# Patient Record
Sex: Male | Born: 1995 | Race: Black or African American | Hispanic: No | Marital: Single | Smoking: Never smoker
Health system: Southern US, Community
[De-identification: ages and names within clinical notes are randomized; demographics above are authoritative.]

## PROBLEM LIST (undated history)

## (undated) DIAGNOSIS — K5190 Ulcerative colitis, unspecified, without complications: Secondary | ICD-10-CM

## (undated) DIAGNOSIS — F319 Bipolar disorder, unspecified: Secondary | ICD-10-CM

---

## 2015-02-02 ENCOUNTER — Encounter (HOSPITAL_COMMUNITY): Payer: Self-pay | Admitting: *Deleted

## 2015-02-02 ENCOUNTER — Emergency Department (HOSPITAL_COMMUNITY)
Admission: EM | Admit: 2015-02-02 | Discharge: 2015-02-02 | Disposition: A | Discharge status: Home / Self Care | Payer: Self-pay | Attending: Emergency Medicine | Admitting: Emergency Medicine

## 2015-02-02 ENCOUNTER — Emergency Department (HOSPITAL_COMMUNITY): Payer: Self-pay

## 2015-02-02 DIAGNOSIS — Z79899 Other long term (current) drug therapy: Secondary | ICD-10-CM | POA: Insufficient documentation

## 2015-02-02 DIAGNOSIS — R110 Nausea: Secondary | ICD-10-CM | POA: Insufficient documentation

## 2015-02-02 DIAGNOSIS — R1084 Generalized abdominal pain: Secondary | ICD-10-CM | POA: Insufficient documentation

## 2015-02-02 DIAGNOSIS — K5190 Ulcerative colitis, unspecified, without complications: Secondary | ICD-10-CM | POA: Insufficient documentation

## 2015-02-02 DIAGNOSIS — R630 Anorexia: Secondary | ICD-10-CM | POA: Insufficient documentation

## 2015-02-02 DIAGNOSIS — F329 Major depressive disorder, single episode, unspecified: Secondary | ICD-10-CM | POA: Insufficient documentation

## 2015-02-02 HISTORY — DX: Ulcerative colitis, unspecified, without complications: K51.90

## 2015-02-02 LAB — COMPREHENSIVE METABOLIC PANEL
ALT: 14 U/L — ABNORMAL LOW (ref 17–63)
AST: 21 U/L (ref 15–41)
Albumin: 3.6 g/dL (ref 3.5–5.0)
Alkaline Phosphatase: 49 U/L (ref 38–126)
Anion gap: 8 (ref 5–15)
BUN: 5 mg/dL — ABNORMAL LOW (ref 6–20)
CO2: 26 mmol/L (ref 22–32)
Calcium: 9.1 mg/dL (ref 8.9–10.3)
Chloride: 106 mmol/L (ref 101–111)
Creatinine, Ser: 0.96 mg/dL (ref 0.61–1.24)
GFR calc Af Amer: >60 mL/min (ref >60)
GFR calc non Af Amer: >60 mL/min (ref >60)
Glucose, Bld: 91 mg/dL (ref 65–99)
Potassium: 5.1 mmol/L (ref 3.5–5.1)
Sodium: 140 mmol/L (ref 135–145)
Total Bilirubin: 0.7 mg/dL (ref 0.3–1.2)
Total Protein: 5.9 g/dL — ABNORMAL LOW (ref 6.5–8.1)

## 2015-02-02 LAB — CBC
HCT: 47.2 % (ref 39.0–52.0)
Hemoglobin: 15.6 g/dL (ref 13.0–17.0)
MCH: 28.3 pg (ref 26.0–34.0)
MCHC: 33.1 g/dL (ref 30.0–36.0)
MCV: 85.5 fL (ref 78.0–100.0)
Platelets: 151 10*3/uL (ref 150–400)
RBC: 5.52 MIL/uL (ref 4.22–5.81)
RDW: 13.1 % (ref 11.5–15.5)
WBC: 8.1 10*3/uL (ref 4.0–10.5)

## 2015-02-02 LAB — URINALYSIS, ROUTINE W REFLEX MICROSCOPIC
BILIRUBIN URINE: NEGATIVE
Glucose, UA: NEGATIVE mg/dL
Hgb urine dipstick: NEGATIVE
KETONES UR: 15 mg/dL — AB
LEUKOCYTES UA: NEGATIVE
NITRITE: NEGATIVE
PROTEIN: NEGATIVE mg/dL
Specific Gravity, Urine: >1.046 — ABNORMAL HIGH (ref 1.005–1.030)
pH: 6 (ref 5.0–8.0)

## 2015-02-02 LAB — LITHIUM LEVEL

## 2015-02-02 LAB — LIPASE, BLOOD: Lipase: 23 U/L (ref 11–51)

## 2015-02-02 MED ORDER — SODIUM CHLORIDE 0.9 % IV SOLN
1000.0000 mL | Freq: Once | INTRAVENOUS | Status: AC
  Administered 2015-02-02: 1000 mL via INTRAVENOUS

## 2015-02-02 MED ORDER — PREDNISONE 20 MG PO TABS
60.0000 mg | ORAL_TABLET | ORAL | Status: AC
  Administered 2015-02-02: 60 mg via ORAL
  Filled 2015-02-02: qty 3

## 2015-02-02 MED ORDER — HYDROCODONE-ACETAMINOPHEN 5-325 MG PO TABS: 1.0000 | ORAL_TABLET | Freq: Four times a day (QID) | ORAL | Status: DC | PRN

## 2015-02-02 MED ORDER — HYDROMORPHONE HCL 1 MG/ML IJ SOLN
0.5000 mg | Freq: Once | INTRAMUSCULAR | Status: AC
  Administered 2015-02-02: 0.5 mg via INTRAVENOUS
  Filled 2015-02-02: qty 1

## 2015-02-02 MED ORDER — LITHIUM CARBONATE 150 MG PO CAPS: 150.0000 mg | ORAL_CAPSULE | Freq: Every day | ORAL | Status: DC

## 2015-02-02 MED ORDER — IOHEXOL 300 MG/ML  SOLN
100.0000 mL | Freq: Once | INTRAMUSCULAR | Status: AC | PRN
  Administered 2015-02-02: 100 mL via INTRAVENOUS

## 2015-02-02 MED ORDER — ONDANSETRON HCL 4 MG/2ML IJ SOLN
4.0000 mg | Freq: Once | INTRAMUSCULAR | Status: AC
  Administered 2015-02-02: 4 mg via INTRAVENOUS
  Filled 2015-02-02: qty 2

## 2015-02-02 MED ORDER — PREDNISONE 20 MG PO TABS: 60.0000 mg | ORAL_TABLET | Freq: Every day | ORAL | Status: AC

## 2015-02-02 NOTE — ED Notes (Signed)
Pt c/o abd pain onset today in all quadrants, denies n/v/d, denies CP & SOB, A&O x 4, pt dx of Ulcerative Colitis

## 2015-02-02 NOTE — ED Notes (Signed)
Patient is alert and orientedx4.  Patient was explained discharge instructions and they understood them with no questions.   

## 2015-02-02 NOTE — ED Notes (Signed)
Pt left with urinal at bedside to provide a sample when he is able.

## 2015-02-02 NOTE — ED Provider Notes (Signed)
CSN: [id number]     Arrival date & time 02/02/15  1350 History   First MD Initiated Contact with Patient 02/02/15 1517     Chief Complaint  Patient presents with  . Abdominal Pain     HPI  She presents with concern of severe abdominal pain. Pain began earlier today, since onset has been persistent, is diffuse, intermittently severe in different areas of the abdomen, without focal pain. Pain is described as sharp, unrelenting. Typically, the patient has brief pain episodes, that resolved with rest. Today's pain has not improved with rest. Patient acknowledges a history of ulcerative colitis, depression, denies other substantial medical problems. No history of prior surgery. No current fever, chills, vomiting, diarrhea. There is nausea, anorexia.   Past Medical History  Diagnosis Date  . Ulcerative colitis (HCC)    History reviewed. No pertinent past surgical history. No family history on file. Social History  Substance Use Topics  . Smoking status: Never Smoker   . Smokeless tobacco: None  . Alcohol Use: No    Review of Systems  Constitutional:       Per HPI, otherwise negative  HENT:       Per HPI, otherwise negative  Respiratory:       Per HPI, otherwise negative  Cardiovascular:       Per HPI, otherwise negative  Gastrointestinal: Positive for nausea. Negative for vomiting.  Endocrine:       Negative aside from HPI  Genitourinary:       Neg aside from HPI   Musculoskeletal:       Per HPI, otherwise negative  Skin: Negative.   Neurological: Negative for syncope.      Allergies  Review of patient's allergies indicates no known allergies.  Home Medications   Prior to Admission medications   Medication Sig Start Date End Date Taking? Authorizing Provider  FLUoxetine (PROZAC) 20 MG capsule Take 20 mg by mouth 2 (two) times daily. Take 20 mg in the morning and 10 mg at night.   Yes Historical Provider, MD  lithium carbonate 150 MG capsule Take 150 mg by  mouth daily.   Yes Historical Provider, MD  mesalamine (CANASA) 1000 MG suppository Place 1,000 mg rectally at bedtime.   Yes Historical Provider, MD  Mesalamine (LIALDA PO) Take 4 capsules by mouth 2 (two) times daily.   Yes Historical Provider, MD   BP 115/73 mmHg  Pulse 90  Temp(Src) 99 F (37.2 C) (Oral)  Resp 12  Ht 5\' 11"  (1.803 m)  Wt 170 lb (77.111 kg)  BMI 23.72 kg/m2  SpO2 99% Physical Exam  Constitutional: He is oriented to person, place, and time. He appears well-developed. No distress.  HENT:  Head: Normocephalic and atraumatic.  Eyes: Conjunctivae and EOM are normal.  Cardiovascular: Normal rate and regular rhythm.   Pulmonary/Chest: Effort normal. No stridor. No respiratory distress.  Abdominal: He exhibits no distension. There is generalized tenderness. There is guarding. There is no rigidity.  Musculoskeletal: He exhibits no edema.  Neurological: He is alert and oriented to person, place, and time.  Skin: Skin is warm and dry.  Psychiatric: He has a normal mood and affect.  Nursing note and vitals reviewed.   ED Course  Procedures (including critical care time) Labs Review Labs Reviewed  COMPREHENSIVE METABOLIC PANEL - Abnormal; Notable for the following:    BUN 5 (*)    Total Protein 5.9 (*)    ALT 14 (*)    All other components within  normal limits  LIPASE, BLOOD  CBC  URINALYSIS, ROUTINE W REFLEX MICROSCOPIC (NOT AT [facility])    Imaging Review Ct Abdomen Pelvis W Contrast  02/02/2015  CLINICAL DATA:  Ulcer colitis. Generalized abdominal pain. Some nausea. EXAM: CT ABDOMEN AND PELVIS WITH CONTRAST TECHNIQUE: Multidetector CT imaging of the abdomen and pelvis was performed using the standard protocol following bolus administration of intravenous contrast. CONTRAST:  OMNIPAQUE IOHEXOL 300 MG/ML  SOLN COMPARISON:  None FINDINGS: Lower chest: Lung bases are clear. Hepatobiliary: No focal hepatic lesion. No biliary duct dilatation. Gallbladder is normal.  Common bile duct is normal. Pancreas: Pancreas is normal. No ductal dilatation. No pancreatic inflammation. Spleen: Normal spleen Adrenals/urinary tract: Adrenal glands and kidneys are normal. The ureters and bladder normal. Stomach/Bowel: The stomach and duodenum are normal. There multiple abnormal loops of small bowel a which are moderately distended by fluid and have submucosal edema. Majority these abnormal loops appear distal in the ileum. The terminal ileum is mildly thickened enhancing as seen on image 55, series 6. There is submucosal edema in loops of distal ileum as seen on coronal image 65, series 6. The submucosal edema measures up to 4 mm. Other loops of more proximal small bowel are decompressed (image 32, series 6). No evidence of high-grade obstruction. The appendix is elongated and partially filled with high-density material (image 65 series 3 and image 55 series 30. This elongated appendix extends in a retrocecal course. The colon itself appears normal. There is a moderate volume stool in the transverse and descending colon. No evidence of mucosal inflammation in the colon or sigmoid colon. The rectum is grossly normal although collapsed distally. Vascular/Lymphatic: Abdominal aorta is normal caliber. There is no retroperitoneal or periportal lymphadenopathy. No pelvic lymphadenopathy. Reproductive: Smaller free fluid the pelvis.  Prostate gland normal. Other: Small free fluid the pelvis Musculoskeletal: No aggressive osseous lesion. IMPRESSION: 1. Evidence of active inflammatory bowel disease with multiple long segments of fluid-filled mildly distended small bowel with submucosal edema. Inflammation of the terminal ileum. No evidence of high-grade obstruction. 2. No evidence of involvement of the colon. The rectum is poorly evaluated. 3. Appendix is elongated and mildly distended but no evidence of inflammation Electronically Signed   By: Genevive Bi M.D.   On: 02/02/2015 17:13   I have  personally reviewed and evaluated these images and lab results as part of my medical decision-making.  5:34 PM On repeat exam the patient has improved. Lithium level pending. Given concern for ulcerative colitis flare, patient will be started on steroids   7:29 PM Lithium level is subtherapeutic. Patient aware of this. No new complaints. Patient will be discharged to follow-up with primary care, gastroenterology. MDM  Male with ulcerative colitis presents with diffuse severe abdominal pain. Here the patient is awake, alert, with a non-peritoneal, though diffusely tender abdomen. No evidence for bacteremia or sepsis. Patient likely has ulcerative colitis flare. The patient is also subtherapeutic in his lithium dosing  Patient will follow-up with gastroenterology.  Gerhard Munch, MD 02/02/15 [phone]

## 2015-02-02 NOTE — Discharge Instructions (Signed)
As discussed, it is important that you follow up with your physician for continued management of your condition. ° °If you develop any new, or concerning changes in your condition, please return to the emergency department immediately. °

## 2015-02-02 NOTE — ED Notes (Signed)
MD at bedside. 

## 2015-02-10 ENCOUNTER — Emergency Department (HOSPITAL_COMMUNITY)
Admission: EM | Admit: 2015-02-10 | Discharge: 2015-02-10 | Disposition: A | Discharge status: Home / Self Care | Payer: Self-pay | Attending: Emergency Medicine | Admitting: Emergency Medicine

## 2015-02-10 ENCOUNTER — Encounter (HOSPITAL_COMMUNITY): Payer: Self-pay | Admitting: Emergency Medicine

## 2015-02-10 DIAGNOSIS — R109 Unspecified abdominal pain: Secondary | ICD-10-CM

## 2015-02-10 DIAGNOSIS — F329 Major depressive disorder, single episode, unspecified: Secondary | ICD-10-CM | POA: Insufficient documentation

## 2015-02-10 DIAGNOSIS — R110 Nausea: Secondary | ICD-10-CM | POA: Insufficient documentation

## 2015-02-10 DIAGNOSIS — R1084 Generalized abdominal pain: Secondary | ICD-10-CM | POA: Insufficient documentation

## 2015-02-10 DIAGNOSIS — Z79899 Other long term (current) drug therapy: Secondary | ICD-10-CM | POA: Insufficient documentation

## 2015-02-10 DIAGNOSIS — K921 Melena: Secondary | ICD-10-CM | POA: Insufficient documentation

## 2015-02-10 DIAGNOSIS — Z8719 Personal history of other diseases of the digestive system: Secondary | ICD-10-CM

## 2015-02-10 LAB — COMPREHENSIVE METABOLIC PANEL
ALT: 13 U/L — AB (ref 17–63)
AST: 18 U/L (ref 15–41)
Albumin: 2.9 g/dL — ABNORMAL LOW (ref 3.5–5.0)
Alkaline Phosphatase: 44 U/L (ref 38–126)
Anion gap: 9 (ref 5–15)
BILIRUBIN TOTAL: 0.3 mg/dL (ref 0.3–1.2)
BUN: 11 mg/dL (ref 6–20)
CO2: 25 mmol/L (ref 22–32)
CREATININE: 0.87 mg/dL (ref 0.61–1.24)
Calcium: 8.6 mg/dL — ABNORMAL LOW (ref 8.9–10.3)
Chloride: 106 mmol/L (ref 101–111)
Glucose, Bld: 111 mg/dL — ABNORMAL HIGH (ref 65–99)
POTASSIUM: 3.8 mmol/L (ref 3.5–5.1)
Sodium: 140 mmol/L (ref 135–145)
TOTAL PROTEIN: 5 g/dL — AB (ref 6.5–8.1)

## 2015-02-10 LAB — CBC WITH DIFFERENTIAL/PLATELET
BASOS ABS: 0 10*3/uL (ref 0.0–0.1)
Basophils Relative: 0 %
EOS PCT: 2 %
Eosinophils Absolute: 0.2 10*3/uL (ref 0.0–0.7)
HCT: 39.5 % (ref 39.0–52.0)
Hemoglobin: 13.1 g/dL (ref 13.0–17.0)
Lymphocytes Relative: 31 %
Lymphs Abs: 2.3 10*3/uL (ref 0.7–4.0)
MCH: 28.4 pg (ref 26.0–34.0)
MCHC: 33.2 g/dL (ref 30.0–36.0)
MCV: 85.7 fL (ref 78.0–100.0)
Monocytes Absolute: 0.4 10*3/uL (ref 0.1–1.0)
Monocytes Relative: 6 %
Neutro Abs: 4.5 10*3/uL (ref 1.7–7.7)
Neutrophils Relative %: 61 %
PLATELETS: 155 10*3/uL (ref 150–400)
RBC: 4.61 MIL/uL (ref 4.22–5.81)
RDW: 13.3 % (ref 11.5–15.5)
WBC: 7.4 10*3/uL (ref 4.0–10.5)

## 2015-02-10 LAB — LIPASE, BLOOD: LIPASE: 32 U/L (ref 11–51)

## 2015-02-10 MED ORDER — METHYLPREDNISOLONE SODIUM SUCC 125 MG IJ SOLR
125.0000 mg | Freq: Once | INTRAMUSCULAR | Status: AC
  Administered 2015-02-10: 125 mg via INTRAVENOUS
  Filled 2015-02-10: qty 2

## 2015-02-10 MED ORDER — MESALAMINE 1.2 G PO TBEC: 2.4000 g | DELAYED_RELEASE_TABLET | Freq: Every day | ORAL | Status: DC

## 2015-02-10 MED ORDER — MESALAMINE 1.2 G PO TBEC
4.8000 g | DELAYED_RELEASE_TABLET | Freq: Two times a day (BID) | ORAL | Status: DC
Start: 2015-02-10 — End: 2015-02-10

## 2015-02-10 MED ORDER — SODIUM CHLORIDE 0.9 % IV BOLUS (SEPSIS)
1000.0000 mL | Freq: Once | INTRAVENOUS | Status: AC
  Administered 2015-02-10: 1000 mL via INTRAVENOUS

## 2015-02-10 MED ORDER — ONDANSETRON HCL 4 MG/2ML IJ SOLN
4.0000 mg | Freq: Once | INTRAMUSCULAR | Status: AC
  Administered 2015-02-10: 4 mg via INTRAVENOUS
  Filled 2015-02-10: qty 2

## 2015-02-10 MED ORDER — PREDNISONE 20 MG PO TABS: ORAL_TABLET | ORAL | Status: DC

## 2015-02-10 MED ORDER — MORPHINE SULFATE (PF) 4 MG/ML IV SOLN
4.0000 mg | Freq: Once | INTRAVENOUS | Status: AC
  Administered 2015-02-10: 4 mg via INTRAVENOUS
  Filled 2015-02-10: qty 1

## 2015-02-10 MED ORDER — HYDROCODONE-ACETAMINOPHEN 5-325 MG PO TABS: 1.0000 | ORAL_TABLET | Freq: Four times a day (QID) | ORAL | Status: DC | PRN

## 2015-02-10 MED ORDER — OXYCODONE-ACETAMINOPHEN 5-325 MG PO TABS
1.0000 | ORAL_TABLET | Freq: Once | ORAL | Status: AC
  Administered 2015-02-10: 1 via ORAL
  Filled 2015-02-10: qty 1

## 2015-02-10 NOTE — ED Notes (Signed)
Stabbing pain in the stomach, generalized. Pt seen recently for same symptoms and it has not resolved.  LBM Sunday. Bright red blood in stool (just a little). Pt has not been passing gas. No urinary difficulties.

## 2015-02-10 NOTE — ED Provider Notes (Signed)
CSN: [id number]     Arrival date & time 02/10/15  0000 History  By signing my name below, I, Bethel Born, attest that this documentation has been prepared under the direction and in the presence of Geoffery Lyons, MD. Electronically Signed: Bethel Born, ED Scribe. 02/10/2015. 1:07 AM    Chief Complaint  Patient presents with  . Abdominal Pain   The history is provided by the patient. No language interpreter was used.   Jose Bowen is a 20 y.o. male with history of ulcerative colitis and depression who presents to the Emergency Department complaining of stabbing,  intermittent, diffuse abdominal pain with onset yesterday morning. This pain feels similar to previous flare ups of ulcerative colitis and he was seen in the ED 1 week ago for similar symptoms where he was prescribed steroids with no sustained relief.  Associated symptoms include nausea and bloody stools. Pt denies vomiting, diarrhea, fever, difficulty urinating,and dysuria. He has had no known sick contact.  Past Medical History  Diagnosis Date  . Ulcerative colitis (HCC)    History reviewed. No pertinent past surgical history. Family History  Problem Relation Age of Onset  . Hypertension Mother   . Cancer Mother   . Hypertension Father    Social History  Substance Use Topics  . Smoking status: Never Smoker   . Smokeless tobacco: None  . Alcohol Use: No    Review of Systems 10 Systems reviewed and all are negative for acute change except as noted in the HPI. Allergies  Review of patient's allergies indicates no known allergies.  Home Medications   Prior to Admission medications   Medication Sig Start Date End Date Taking? Authorizing Provider  FLUoxetine (PROZAC) 20 MG capsule Take 20 mg by mouth 2 (two) times daily. Take 20 mg in the morning and 10 mg at night.    Historical Provider, MD  HYDROcodone-acetaminophen (NORCO/VICODIN) 5-325 MG tablet Take 1 tablet by mouth every 6 (six) hours as needed for  severe pain. 02/02/15   Gerhard Munch, MD  lithium carbonate 150 MG capsule Take 1 capsule (150 mg total) by mouth daily. 02/02/15   Gerhard Munch, MD  mesalamine (CANASA) 1000 MG suppository Place 1,000 mg rectally at bedtime.    Historical Provider, MD  Mesalamine (LIALDA PO) Take 4 capsules by mouth 2 (two) times daily.    Historical Provider, MD   Ht  (1.803 m)  Wt 170 lb (77.111 kg)  BMI 23.72 kg/m2 Physical Exam  Constitutional: He is oriented to person, place, and time. He appears well-developed and well-nourished.  HENT:  Head: Normocephalic and atraumatic.  Eyes: EOM are normal.  Neck: Normal range of motion.  Cardiovascular: Normal rate, regular rhythm, normal heart sounds and intact distal pulses.   Pulmonary/Chest: Effort normal and breath sounds normal. No respiratory distress.  Abdominal: Soft. He exhibits no distension. There is tenderness. There is no rebound and no guarding.  There is generalized abdominal tenderness.   Musculoskeletal: Normal range of motion.  Neurological: He is alert and oriented to person, place, and time.  Skin: Skin is warm and dry.  Psychiatric: He has a normal mood and affect. Judgment normal.  Nursing note and vitals reviewed.   ED Course  Procedures (including critical care time) COORDINATION OF CARE: 1:02 AM Discussed treatment plan which includes lab work, IVF, morphine, Zofran, and Solu-Medrol with pt at bedside and pt agreed to plan.  Labs Review Labs Reviewed  COMPREHENSIVE METABOLIC PANEL  CBC WITH DIFFERENTIAL/PLATELET  LIPASE, BLOOD    Imaging Review No results found. I have personally reviewed and evaluated these lab results as part of my medical decision-making.    MDM   Final diagnoses:  None    Patient with history of ulcerative colitis. He presents with abdominal pain. His workup reveals no significant laboratory abnormality and he appears nontoxic with a basically benign abdominal exam. He is feeling  better after steroids, fluids, and pain medication. I see no indication for further imaging at this time. He will be treated with prednisone, pain medication, and follow-up with his GI doctor. He is apparently supposed to be taking some sort of medication for his UC, however he has not been on this in several months.  I personally performed the services described in this documentation, which was scribed in my presence. The recorded information has been reviewed and is accurate.       Geoffery Lyons, MD 02/10/15 [phone]

## 2015-02-10 NOTE — Discharge Instructions (Signed)
Prednisone as prescribed.  Hydrocodone as prescribed as needed for pain.  Follow-up with your gastroenterologist in the next week, and return to the ER if symptoms significantly worsen or change.   Abdominal Pain, Adult Many things can cause abdominal pain. Usually, abdominal pain is not caused by a disease and will improve without treatment. It can often be observed and treated at home. Your health care provider will do a physical exam and possibly order blood tests and X-rays to help determine the seriousness of your pain. However, in many cases, more time must pass before a clear cause of the pain can be found. Before that point, your health care provider may not know if you need more testing or further treatment. HOME CARE INSTRUCTIONS Monitor your abdominal pain for any changes. The following actions may help to alleviate any discomfort you are experiencing:  Only take over-the-counter or prescription medicines as directed by your health care provider.  Do not take laxatives unless directed to do so by your health care provider.  Try a clear liquid diet (broth, tea, or water) as directed by your health care provider. Slowly move to a bland diet as tolerated. SEEK MEDICAL CARE IF:  You have unexplained abdominal pain.  You have abdominal pain associated with nausea or diarrhea.  You have pain when you urinate or have a bowel movement.  You experience abdominal pain that wakes you in the night.  You have abdominal pain that is worsened or improved by eating food.  You have abdominal pain that is worsened with eating fatty foods.  You have a fever. SEEK IMMEDIATE MEDICAL CARE IF:  Your pain does not go away within 2 hours.  You keep throwing up (vomiting).  Your pain is felt only in portions of the abdomen, such as the right side or the left lower portion of the abdomen.  You pass bloody or black tarry stools. MAKE SURE YOU:  Understand these instructions.  Will watch  your condition.  Will get help right away if you are not doing well or get worse.   This information is not intended to replace advice given to you by your health care provider. Make sure you discuss any questions you have with your health care provider.   Document Released: 10/14/2004 Document Revised: 09/25/2014 Document Reviewed: 09/13/2012 [employer] Interactive Patient Education [employer]

## 2015-09-27 ENCOUNTER — Emergency Department (HOSPITAL_COMMUNITY): Payer: BLUE CROSS/BLUE SHIELD

## 2015-09-27 ENCOUNTER — Encounter (HOSPITAL_COMMUNITY): Payer: Self-pay

## 2015-09-27 ENCOUNTER — Inpatient Hospital Stay (HOSPITAL_COMMUNITY)
Admission: EM | Admit: 2015-09-27 | Discharge: 2015-10-01 | DRG: 603 | Disposition: A | Discharge status: Home / Self Care | Payer: BLUE CROSS/BLUE SHIELD | Attending: Internal Medicine | Admitting: Internal Medicine

## 2015-09-27 DIAGNOSIS — Z79899 Other long term (current) drug therapy: Secondary | ICD-10-CM

## 2015-09-27 DIAGNOSIS — L0291 Cutaneous abscess, unspecified: Secondary | ICD-10-CM

## 2015-09-27 DIAGNOSIS — K122 Cellulitis and abscess of mouth: Secondary | ICD-10-CM | POA: Diagnosis present

## 2015-09-27 DIAGNOSIS — Z9114 Patient's other noncompliance with medication regimen: Secondary | ICD-10-CM

## 2015-09-27 DIAGNOSIS — Z9119 Patient's noncompliance with other medical treatment and regimen: Secondary | ICD-10-CM

## 2015-09-27 DIAGNOSIS — L03221 Cellulitis of neck: Principal | ICD-10-CM | POA: Diagnosis present

## 2015-09-27 DIAGNOSIS — F319 Bipolar disorder, unspecified: Secondary | ICD-10-CM | POA: Diagnosis present

## 2015-09-27 DIAGNOSIS — K5190 Ulcerative colitis, unspecified, without complications: Secondary | ICD-10-CM | POA: Diagnosis present

## 2015-09-27 DIAGNOSIS — R220 Localized swelling, mass and lump, head: Secondary | ICD-10-CM | POA: Diagnosis present

## 2015-09-27 DIAGNOSIS — R609 Edema, unspecified: Secondary | ICD-10-CM | POA: Diagnosis not present

## 2015-09-27 DIAGNOSIS — F1210 Cannabis abuse, uncomplicated: Secondary | ICD-10-CM | POA: Diagnosis present

## 2015-09-27 DIAGNOSIS — Z91199 Patient's noncompliance with other medical treatment and regimen due to unspecified reason: Secondary | ICD-10-CM

## 2015-09-27 HISTORY — DX: Bipolar disorder, unspecified: F31.9

## 2015-09-27 LAB — CBC WITH DIFFERENTIAL/PLATELET
Basophils Absolute: 0 10*3/uL (ref 0.0–0.1)
Basophils Relative: 0 %
Eosinophils Absolute: 0.6 10*3/uL (ref 0.0–0.7)
Eosinophils Relative: 4 %
HEMATOCRIT: 44 % (ref 39.0–52.0)
Hemoglobin: 14.9 g/dL (ref 13.0–17.0)
LYMPHS ABS: 1.9 10*3/uL (ref 0.7–4.0)
LYMPHS PCT: 11 %
MCH: 28 pg (ref 26.0–34.0)
MCHC: 33.9 g/dL (ref 30.0–36.0)
MCV: 82.7 fL (ref 78.0–100.0)
MONO ABS: 1.4 10*3/uL — AB (ref 0.1–1.0)
MONOS PCT: 8 %
NEUTROS ABS: 13.5 10*3/uL — AB (ref 1.7–7.7)
Neutrophils Relative %: 77 %
Platelets: 189 10*3/uL (ref 150–400)
RBC: 5.32 MIL/uL (ref 4.22–5.81)
RDW: 13.3 % (ref 11.5–15.5)
WBC: 17.4 10*3/uL — ABNORMAL HIGH (ref 4.0–10.5)

## 2015-09-27 LAB — BASIC METABOLIC PANEL
ANION GAP: 8 (ref 5–15)
BUN: 10 mg/dL (ref 6–20)
CALCIUM: 8.7 mg/dL — AB (ref 8.9–10.3)
CO2: 27 mmol/L (ref 22–32)
Chloride: 102 mmol/L (ref 101–111)
Creatinine, Ser: 0.96 mg/dL (ref 0.61–1.24)
GFR calc Af Amer: >60 mL/min (ref >60)
GFR calc non Af Amer: >60 mL/min (ref >60)
Glucose, Bld: 98 mg/dL (ref 65–99)
Potassium: 3.4 mmol/L — ABNORMAL LOW (ref 3.5–5.1)
Sodium: 137 mmol/L (ref 135–145)

## 2015-09-27 MED ORDER — IOPAMIDOL (ISOVUE-300) INJECTION 61%
100.0000 mL | Freq: Once | INTRAVENOUS | Status: AC | PRN
  Administered 2015-09-27: 75 mL via INTRAVENOUS

## 2015-09-27 MED ORDER — CLINDAMYCIN PHOSPHATE 600 MG/50ML IV SOLN
600.0000 mg | Freq: Once | INTRAVENOUS | Status: AC
  Administered 2015-09-27: 600 mg via INTRAVENOUS
  Filled 2015-09-27: qty 50

## 2015-09-27 MED ORDER — KETOROLAC TROMETHAMINE 30 MG/ML IJ SOLN
30.0000 mg | Freq: Once | INTRAMUSCULAR | Status: AC
  Administered 2015-09-27: 30 mg via INTRAVENOUS
  Filled 2015-09-27: qty 1

## 2015-09-27 NOTE — ED Provider Notes (Signed)
[facility]-EMERGENCY DEPT Provider Note   CSN: [id number] Arrival date & time: 09/27/15  2110     History   Chief Complaint Chief Complaint  Patient presents with  . Abscess    HPI Jose EllisJustin C Bowen is a 20 y.o. male.  Patients with history of ulcerative colitis, on mesalamine, no immune compromising medications -- presents with complaint of groin abscess of his chin for the past 4 days. Patient has had some drainage from the chin. No fevers or chills. Patient states that it hurts more when he opens his mouth or swallows. He is able to eat and drink. No nausea or vomiting. The onset of this condition was acute. The course is worsening.  Alleviating factors: none.        Past Medical History:  Diagnosis Date  . Ulcerative colitis (HCC)     There are no active problems to display for this patient.   History reviewed. No pertinent surgical history.     Home Medications    Prior to Admission medications   Medication Sig Start Date End Date Taking? Authorizing Provider  FLUoxetine (PROZAC) 20 MG capsule Take 20 mg by mouth 2 (two) times daily. Take 20 mg in the morning and 10 mg at night.    Historical Provider, MD  HYDROcodone-acetaminophen (NORCO) 5-325 MG tablet Take 1-2 tablets by mouth every 6 (six) hours as needed. 02/10/15   Geoffery Lyonsouglas Delo, MD  lithium carbonate 150 MG capsule Take 1 capsule (150 mg total) by mouth daily. 02/02/15   Gerhard Munchobert Lockwood, MD  mesalamine (LIALDA) 1.2 g EC tablet Take 2 tablets (2.4 g total) by mouth daily with breakfast. 02/10/15   Geoffery Lyonsouglas Delo, MD  predniSONE (DELTASONE) 20 MG tablet Take 60 mg daily for 3 days, then 40 mg daily for 3 days, then 20 mg daily for 3 days. 02/10/15   Geoffery Lyonsouglas Delo, MD    Family History Family History  Problem Relation Age of Onset  . Hypertension Mother   . Cancer Mother   . Hypertension Father     Social History Social History  Substance Use Topics  . Smoking status: Never Smoker  . Smokeless tobacco: Never  Used  . Alcohol use No     Allergies   Review of patient's allergies indicates no known allergies.   Review of Systems Review of Systems  Constitutional: Negative for fever.  HENT: Negative for rhinorrhea and sore throat.   Eyes: Negative for redness.  Respiratory: Negative for cough.   Cardiovascular: Negative for chest pain.  Gastrointestinal: Negative for abdominal pain, diarrhea, nausea and vomiting.  Genitourinary: Negative for dysuria.  Musculoskeletal: Positive for neck pain. Negative for myalgias.  Skin: Negative for color change and rash.       Positive for abscess  Neurological: Negative for headaches.  Hematological: Negative for adenopathy.     Physical Exam Updated Vital Signs BP 137/66 (BP Location: Right Arm)   Pulse 66   Temp 99.4 F (37.4 C) (Oral)   Resp 14   Ht 5\' 11"  (1.803 m)   Wt 77.1 kg   SpO2 100%   BMI 23.71 kg/m   Physical Exam  Constitutional: He appears well-developed and well-nourished.  HENT:  Head: Normocephalic and atraumatic.  Mouth/Throat: Oropharynx is clear and moist.  Large area of firmness involving the submandibular space. Patient does have some anterior neck tenderness. No tenderness or firmness with palpation below the tongue. No tongue protrusion.   Eyes: Conjunctivae are normal. Right eye exhibits no discharge. Left  eye exhibits no discharge.  Neck: Normal range of motion. Neck supple.  Cardiovascular: Normal rate, regular rhythm and normal heart sounds.   Pulmonary/Chest: Effort normal and breath sounds normal.  Abdominal: Soft. There is no tenderness.  Lymphadenopathy:    He has cervical adenopathy.  Neurological: He is alert.  Skin: Skin is warm and dry.  Psychiatric: He has a normal mood and affect.  Nursing note and vitals reviewed.    ED Treatments / Results  Labs (all labs ordered are listed, but only abnormal results are displayed) Labs Reviewed  CBC WITH DIFFERENTIAL/PLATELET - Abnormal; Notable for the  following:       Result Value   WBC 17.4 (*)    Neutro Abs 13.5 (*)    Monocytes Absolute 1.4 (*)    All other components within normal limits  BASIC METABOLIC PANEL - Abnormal; Notable for the following:    Potassium 3.4 (*)    Calcium 8.7 (*)    All other components within normal limits    EKG  EKG Interpretation None       Radiology Ct Soft Tissue Neck W Contrast  Result Date: 09/27/2015 CLINICAL DATA:  Initial valuation for abscess on left chin. EXAM: CT NECK WITH CONTRAST TECHNIQUE: Multidetector CT imaging of the neck was performed using the standard protocol following the bolus administration of intravenous contrast. CONTRAST:  75mL ISOVUE-300 IOPAMIDOL (ISOVUE-300) INJECTION 61% COMPARISON:  None. FINDINGS: Visualized portions of the brain are unremarkable. Partially visualized globes and orbits within normal limits. Retention cyst present within the right maxillary sinus. Minimal mucosal thickening within the ethmoidal air cells. Paranasal sinuses are otherwise clear. No mastoid effusion. Middle ear cavities are clear. Salivary glands including the parotid glands and submandibular glands are normal. There is extensive soft tissue swelling with inflammatory stranding involving the shin, extending into the submental region and bilateral submandibular spaces. Inflammatory stranding and induration extends posteriorly towards the carotid spaces posteriorly. Inferior extension down the anterior aspect of the neck. Possible early involvement of the floor of mouth with the floor of mouth musculature somewhat enlarged and edematous in appearance. Tongue appears to be lifted superiorly. There is ill-defined hypodensity with faint rim enhancement at the chin that measures approximately 4.6 x 0.7 x 0.8 cm. Finding compatible with phlegmon/early abscess formation. Slight extension of phlegmon/abscess more posteriorly within the submental region (series 7, image 52). Superior extension anterior to  the right mandibular body (series 3, image 59). Prominent level 1A nodes measure up to 9 mm, likely reactive. No other significant adenopathy within the neck. Oral cavity itself within normal limits. No acute abnormality about the dentition. Palatine tonsils normal. Parapharyngeal fat preserved. Nasopharynx normal. No retropharyngeal fluid collection. Epiglottis normal. Vallecula clear. Hypopharynx and supraglottic larynx within normal limits. True cords symmetric and normal. Subglottic airway grossly clear, although evaluation limited by motion. Thyroid gland grossly unremarkable, although evaluation limited by motion. Partially visualized mediastinum grossly unremarkable. Visualized lungs are clear. Normal intravascular enhancement seen throughout the neck. No acute osseous abnormality. No worrisome lytic or blastic osseous lesions. 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 Superimposed somewhat ill-defined phlegmon/early abscess at the chin as detailed above. Close clinical monitoring is recommended. Results were called by telephone at the time of interpretation on 09/27/2015 at 11:54 pm to the physician assistant taking care the patient [facility] , who verbally acknowledged these results. Electronically Signed  By: Rise Mu M.D.   On: 09/27/2015 23:56    Procedures Procedures (including critical care time)  Medications Ordered in ED Medications  clindamycin (CLEOCIN) IVPB 600 mg (600 mg Intravenous New Bag/Given 09/27/15 2251)  ketorolac (TORADOL) 30 MG/ML injection 30 mg (30 mg Intravenous Given 09/27/15 2251)  iopamidol (ISOVUE-300) 61 % injection 100 mL (75 mLs Intravenous Contrast Given 09/27/15 2320)     Initial Impression / Assessment and Plan / ED Course  I have reviewed the triage vital signs and the nursing  notes.  Pertinent labs & imaging results that were available during my care of the patient were reviewed by me and considered in my medical decision making (see chart for details).  Clinical Course   Patient seen and examined. Work-up initiated. Medications ordered. Feel that given neck symptoms, will need to image the extent of the abscess. Will also give dose of IV antibiotics.  Vital signs reviewed and are as follows: BP 137/66 (BP Location: Right Arm)   Pulse 66   Temp 99.4 F (37.4 C) (Oral)   Resp 14   Ht 5\' 11"  (1.803 m)   Wt 77.1 kg   SpO2 100%   BMI 23.71 kg/m    Spoke with radiologist regarding CT findings. Will admit for monitoring. There does not appear to be any immanent airway compromise or surgical emergency at this time requiring emergent ENT consultation.   Spoke with Dr. Ophelia Charter of [facility] who will see and admit patient.   Pt updated. Exam is stable. He will notify nursing if he feels any worse.   CRITICAL CARE Performed by: Carolee Rota Total critical care time: 30 minutes Critical care time was exclusive of separately billable procedures and treating other patients. Critical care was necessary to treat or prevent imminent or life-threatening deterioration. Critical care was time spent personally by me on the following activities: development of treatment plan with patient and/or surrogate as well as nursing, discussions with consultants, evaluation of patient's response to treatment, examination of patient, obtaining history from patient or surrogate, ordering and performing treatments and interventions, ordering and review of laboratory studies, ordering and review of radiographic studies, pulse oximetry and re-evaluation of patient's condition.    Final Clinical Impressions(s) / ED Diagnoses   Final diagnoses:  Abscess  Cellulitis of submandibular region   Admit for IV abx, monitoring of submandibular space infection.   New Prescriptions New  Prescriptions   No medications on file     Renne Crigler, PA-C 09/28/15 0037    Lavera Guise, MD 09/28/15 [phone]

## 2015-09-27 NOTE — ED Triage Notes (Signed)
Pt presents with an abscess on his L chin. Pt states that it popped up 3 days ago and he tried to pop it initially. A&Ox4. No bleeding at this time.

## 2015-09-28 ENCOUNTER — Encounter (HOSPITAL_COMMUNITY): Payer: Self-pay | Admitting: Internal Medicine

## 2015-09-28 DIAGNOSIS — K122 Cellulitis and abscess of mouth: Secondary | ICD-10-CM | POA: Diagnosis present

## 2015-09-28 DIAGNOSIS — K5190 Ulcerative colitis, unspecified, without complications: Secondary | ICD-10-CM | POA: Diagnosis present

## 2015-09-28 DIAGNOSIS — L0291 Cutaneous abscess, unspecified: Secondary | ICD-10-CM

## 2015-09-28 DIAGNOSIS — R220 Localized swelling, mass and lump, head: Secondary | ICD-10-CM | POA: Diagnosis present

## 2015-09-28 DIAGNOSIS — F1210 Cannabis abuse, uncomplicated: Secondary | ICD-10-CM | POA: Diagnosis present

## 2015-09-28 DIAGNOSIS — Z91199 Patient's noncompliance with other medical treatment and regimen due to unspecified reason: Secondary | ICD-10-CM

## 2015-09-28 DIAGNOSIS — L03221 Cellulitis of neck: Secondary | ICD-10-CM | POA: Diagnosis present

## 2015-09-28 DIAGNOSIS — F3170 Bipolar disorder, currently in remission, most recent episode unspecified: Secondary | ICD-10-CM | POA: Diagnosis not present

## 2015-09-28 DIAGNOSIS — F319 Bipolar disorder, unspecified: Secondary | ICD-10-CM | POA: Diagnosis present

## 2015-09-28 DIAGNOSIS — Z9114 Patient's other noncompliance with medication regimen: Secondary | ICD-10-CM | POA: Diagnosis not present

## 2015-09-28 DIAGNOSIS — Z79899 Other long term (current) drug therapy: Secondary | ICD-10-CM | POA: Diagnosis not present

## 2015-09-28 DIAGNOSIS — R609 Edema, unspecified: Secondary | ICD-10-CM | POA: Diagnosis present

## 2015-09-28 DIAGNOSIS — Z9119 Patient's noncompliance with other medical treatment and regimen: Secondary | ICD-10-CM

## 2015-09-28 LAB — CBC
HEMATOCRIT: 42.4 % (ref 39.0–52.0)
HEMOGLOBIN: 13.9 g/dL (ref 13.0–17.0)
MCH: 27.9 pg (ref 26.0–34.0)
MCHC: 32.8 g/dL (ref 30.0–36.0)
MCV: 85.1 fL (ref 78.0–100.0)
Platelets: 180 10*3/uL (ref 150–400)
RBC: 4.98 MIL/uL (ref 4.22–5.81)
RDW: 13.6 % (ref 11.5–15.5)
WBC: 16.7 10*3/uL — ABNORMAL HIGH (ref 4.0–10.5)

## 2015-09-28 LAB — RAPID URINE DRUG SCREEN, HOSP PERFORMED
AMPHETAMINES: NOT DETECTED
BENZODIAZEPINES: NOT DETECTED
Barbiturates: NOT DETECTED
Cocaine: NOT DETECTED
Opiates: NOT DETECTED
TETRAHYDROCANNABINOL: POSITIVE — AB

## 2015-09-28 LAB — BASIC METABOLIC PANEL
Anion gap: 6 (ref 5–15)
BUN: 8 mg/dL (ref 6–20)
CHLORIDE: 106 mmol/L (ref 101–111)
CO2: 26 mmol/L (ref 22–32)
CREATININE: 0.86 mg/dL (ref 0.61–1.24)
Calcium: 8.5 mg/dL — ABNORMAL LOW (ref 8.9–10.3)
GFR calc Af Amer: >60 mL/min (ref >60)
GLUCOSE: 91 mg/dL (ref 65–99)
POTASSIUM: 3.6 mmol/L (ref 3.5–5.1)
SODIUM: 138 mmol/L (ref 135–145)

## 2015-09-28 LAB — HIV ANTIBODY (ROUTINE TESTING W REFLEX): HIV SCREEN 4TH GENERATION: NONREACTIVE

## 2015-09-28 MED ORDER — OXYCODONE HCL 5 MG PO TABS
5.0000 mg | ORAL_TABLET | ORAL | Status: DC | PRN
  Administered 2015-09-28 – 2015-09-29 (×5): 5 mg via ORAL
  Filled 2015-09-28 (×6): qty 1

## 2015-09-28 MED ORDER — MESALAMINE 1.2 G PO TBEC
2.4000 g | DELAYED_RELEASE_TABLET | Freq: Every day | ORAL | Status: DC
  Administered 2015-09-28 – 2015-10-01 (×4): 2.4 g via ORAL
  Filled 2015-09-28 (×4): qty 2

## 2015-09-28 MED ORDER — ACETAMINOPHEN 650 MG RE SUPP
650.0000 mg | Freq: Four times a day (QID) | RECTAL | Status: DC | PRN
Start: 2015-09-28 — End: 2015-10-01

## 2015-09-28 MED ORDER — KETOROLAC TROMETHAMINE 30 MG/ML IJ SOLN
30.0000 mg | Freq: Four times a day (QID) | INTRAMUSCULAR | Status: DC | PRN
Start: 2015-09-28 — End: 2015-10-01
  Administered 2015-09-28 – 2015-10-01 (×7): 30 mg via INTRAVENOUS
  Filled 2015-09-28 (×7): qty 1

## 2015-09-28 MED ORDER — MESALAMINE 1000 MG RE SUPP
1000.0000 mg | Freq: Every evening | RECTAL | Status: DC | PRN
  Filled 2015-09-28: qty 1

## 2015-09-28 MED ORDER — FLUOXETINE HCL 20 MG PO CAPS
20.0000 mg | ORAL_CAPSULE | Freq: Every day | ORAL | Status: DC
  Administered 2015-09-28 – 2015-10-01 (×4): 20 mg via ORAL
  Filled 2015-09-28 (×5): qty 1

## 2015-09-28 MED ORDER — LITHIUM CARBONATE 150 MG PO CAPS
150.0000 mg | ORAL_CAPSULE | Freq: Every day | ORAL | Status: DC
  Administered 2015-09-28 – 2015-10-01 (×4): 150 mg via ORAL
  Filled 2015-09-28 (×4): qty 1

## 2015-09-28 MED ORDER — QUETIAPINE FUMARATE ER 50 MG PO TB24
150.0000 mg | ORAL_TABLET | Freq: Every day | ORAL | Status: DC
  Administered 2015-09-28 – 2015-09-30 (×4): 150 mg via ORAL
  Filled 2015-09-28 (×4): qty 3

## 2015-09-28 MED ORDER — FLUOXETINE HCL 10 MG PO CAPS
10.0000 mg | ORAL_CAPSULE | Freq: Every day | ORAL | Status: DC
  Administered 2015-09-28 – 2015-09-30 (×4): 10 mg via ORAL
  Filled 2015-09-28 (×4): qty 1

## 2015-09-28 MED ORDER — VANCOMYCIN HCL 10 G IV SOLR
1500.0000 mg | Freq: Two times a day (BID) | INTRAVENOUS | Status: DC
  Administered 2015-09-28 – 2015-10-01 (×7): 1500 mg via INTRAVENOUS
  Filled 2015-09-28 (×8): qty 1500

## 2015-09-28 MED ORDER — ONDANSETRON HCL 4 MG/2ML IJ SOLN: 4.0000 mg | Freq: Four times a day (QID) | INTRAMUSCULAR | Status: DC | PRN

## 2015-09-28 MED ORDER — ACETAMINOPHEN 325 MG PO TABS
650.0000 mg | ORAL_TABLET | Freq: Four times a day (QID) | ORAL | Status: DC | PRN
Start: 2015-09-28 — End: 2015-10-01
  Administered 2015-09-28 – 2015-10-01 (×4): 650 mg via ORAL
  Filled 2015-09-28 (×6): qty 2

## 2015-09-28 MED ORDER — ONDANSETRON HCL 4 MG PO TABS: 4.0000 mg | ORAL_TABLET | Freq: Four times a day (QID) | ORAL | Status: DC | PRN

## 2015-09-28 NOTE — Progress Notes (Signed)
Pharmacy Antibiotic Note  Jose Bowen is a 20 y.o. male admitted on 09/27/2015 with cellulitis of submandible space.  In the ED patient received Clindamycin 600mg  IV x 1 dose.  Pharmacy has been consulted for Vancomycin dosing.  Plan: Vancomycin 1500mg  IV q12h  - Vancomycin Trough Goal: 10-15 mcg/ml F/U cultures/sensitivities Follow renal function Check vancomycin trough level when appropriate.  Height: 5\' 11"  (180.3 cm) Weight: 170 lb (77.1 kg) IBW/kg (Calculated) : 75.3  Temp (24hrs), Avg:99 F (37.2 C), Min:98.5 F (36.9 C), Max:99.4 F (37.4 C)   Recent Labs Lab 09/27/15 2241  WBC 17.4*  CREATININE 0.96    Estimated Creatinine Clearance: 129.6 mL/min (by C-G formula based on SCr of 0.96 mg/dL).    No Known Allergies  Antimicrobials this admission: 9/9 Clindamycin x 1 dose 9/10 Vanc >>    Dose adjustments this admission:    Microbiology results: 9/9 Wound Cx:    Thank you for allowing pharmacy to be a part of this patient's care.  Maryellen PilePoindexter, Sawyer Mentzer Trefz, PharmD 09/28/2015 2:23 AM

## 2015-09-28 NOTE — Progress Notes (Signed)
PROGRESS NOTE    Jose Bowen  ZOX:[id number] DOB: [birth year] DOA: 09/27/2015 PCP: No PCP Per Patient   Chief Complaint  Patient presents with  . Abscess   Brief Narrative:  HPI on 09/28/2015 by Dr. Crista Elliot is a 20 y.o. male with medical history significant of UC and bipolar disorder presenting with a submental infection.  He reports extreme pain on chin with increasing swelling since Thursday.  Initially thought it was an ingrown hair but he is not sure.  No fevers. Having difficulty swallowing and speaking due to discomfort in chin region. No tongue edema, no throat pain or edema.  Assessment & Plan   Submandibular cellulitis/?abscess -CT Soft tissue neck: Extensive inflammatory stranding and swelling and induration at the chin with extension into the submental region and bilateral submandibular spaces -ENT Surgery consulted and appreciated- continue IV antibiotics, unlikely that it will need surgical drainiange -Currently draining -  Leukocytosis -Improving, secondary to the above -Continue to monitor CBC  Ulcerative colitis -Continue mesalamine -Follow-up with gastroenterologist or PCP upon discharge  ?Bipolar disorder -Continue Prozac, lithium, Seroquel  Medical noncompliance -Will need primary care physician, we'll consult case management  Marijuana abuse -Toxicology screen positive for marijuana -Discussed cessation  DVT Prophylaxis  Early ambulation/SCDs  Code Status: Full  Family Communication: None at bedside  Disposition Plan: Will continue IV antibiotics  Consultants ENT, Dr. Pollyann Kennedy  Procedures  None  Antibiotics   Anti-infectives    Start     Dose/Rate Route Frequency Ordered Stop   09/28/15 0300  vancomycin (VANCOCIN) 1,500 mg in sodium chloride 0.9 % 500 mL IVPB     1,500 mg 250 mL/hr over 120 Minutes Intravenous Every 12 hours 09/28/15 0223     09/27/15 2245  clindamycin (CLEOCIN) IVPB 600 mg     600 mg 100 mL/hr  over 30 Minutes Intravenous  Once 09/27/15 2230 09/28/15 0042      Subjective:   Jose Bowen seen and examined today.  Patient denies pain. Does have some drainage in the chin area.  Denies chest pain, shortness of breath, nausea, vomiting, abdominal pain, headache, problems swallowing.  Objective:   Vitals:   09/28/15 0008 09/28/15 0105 09/28/15 0154 09/28/15 0541  BP: 119/71 110/59 124/63 (!) 108/55  Pulse: 72 81 61 76  Resp: 16 20 18 16   Temp: 98.5 F (36.9 C)  99.2 F (37.3 C) 98.8 F (37.1 C)  TempSrc: Oral  Oral Oral  SpO2: 99% 100% 100% 100%  Weight:      Height:        Intake/Output Summary (Last 24 hours) at 09/28/15 1136 Last data filed at 09/28/15 0500  Gross per 24 hour  Intake              620 ml  Output                0 ml  Net              620 ml   Filed Weights   09/27/15 2134  Weight: 77.1 kg (170 lb)    Exam  General: Well developed, well nourished, NAD, appears stated age  HEENT: NCAT, PERRLA, EOMI, Anicteic Sclera, mucous membranes moist. Submandibular mass, small amount of discharge   Neck: Supple, no JVD, no masses  Cardiovascular: S1 S2 auscultated, no rubs, murmurs or gallops. Regular rate and rhythm.  Respiratory: Clear to auscultation bilaterally with equal chest rise  Abdomen: Soft, nontender, nondistended, + bowel sounds  Extremities: warm dry without cyanosis clubbing or edema  Neuro: AAOx3, nonfocal   Psych: Normal affect and demeanor with intact judgement and insight   Data Reviewed: I have personally reviewed following labs and imaging studies  CBC:  Recent Labs Lab 09/27/15 2241 09/28/15 0500  WBC 17.4* 16.7*  NEUTROABS 13.5*  --   HGB 14.9 13.9  HCT 44.0 42.4  MCV 82.7 85.1  PLT 189 180   Basic Metabolic Panel:  Recent Labs Lab 09/27/15 2241 09/28/15 0500  NA 137 138  K 3.4* 3.6  CL 102 106  CO2 27 26  GLUCOSE 98 91  BUN 10 8  CREATININE 0.96 0.86  CALCIUM 8.7* 8.5*   GFR: Estimated  Creatinine Clearance: 144.7 mL/min (by C-G formula based on SCr of 0.86 mg/dL). Liver Function Tests: No results for input(s): AST, ALT, ALKPHOS, BILITOT, PROT, ALBUMIN in the last 168 hours. No results for input(s): LIPASE, AMYLASE in the last 168 hours. No results for input(s): AMMONIA in the last 168 hours. Coagulation Profile: No results for input(s): INR, PROTIME in the last 168 hours. Cardiac Enzymes: No results for input(s): CKTOTAL, CKMB, CKMBINDEX, TROPONINI in the last 168 hours. BNP (last 3 results) No results for input(s): PROBNP in the last 8760 hours. HbA1C: No results for input(s): HGBA1C in the last 72 hours. CBG: No results for input(s): GLUCAP in the last 168 hours. Lipid Profile: No results for input(s): CHOL, HDL, LDLCALC, TRIG, CHOLHDL, LDLDIRECT in the last 72 hours. Thyroid Function Tests: No results for input(s): TSH, T4TOTAL, FREET4, T3FREE, THYROIDAB in the last 72 hours. Anemia Panel: No results for input(s): VITAMINB12, FOLATE, FERRITIN, TIBC, IRON, RETICCTPCT in the last 72 hours. Urine analysis:    Component Value Date/Time   COLORURINE YELLOW 02/02/2015 1916   APPEARANCEUR CLEAR 02/02/2015 1916   LABSPEC >1.046 (H) 02/02/2015 1916   PHURINE 6.0 02/02/2015 1916   GLUCOSEU NEGATIVE 02/02/2015 1916   HGBUR NEGATIVE 02/02/2015 1916   BILIRUBINUR NEGATIVE 02/02/2015 1916   KETONESUR 15 (A) 02/02/2015 1916   PROTEINUR NEGATIVE 02/02/2015 1916   NITRITE NEGATIVE 02/02/2015 1916   LEUKOCYTESUR NEGATIVE 02/02/2015 1916   Sepsis Labs: @LABRCNTIP (procalcitonin:4,lacticidven:4)  )No results found for this or any previous visit (from the past 240 hour(s)).    Radiology Studies: Ct Soft Tissue Neck W Contrast  Result Date: 09/27/2015 CLINICAL DATA:  Initial valuation for abscess on left chin. EXAM: CT NECK WITH CONTRAST TECHNIQUE: Multidetector CT imaging of the neck was performed using the standard protocol following the bolus administration of  intravenous contrast. CONTRAST:  75mL ISOVUE-300 IOPAMIDOL (ISOVUE-300) INJECTION 61% COMPARISON:  None. FINDINGS: Visualized portions of the brain are unremarkable. Partially visualized globes and orbits within normal limits. Retention cyst present within the right maxillary sinus. Minimal mucosal thickening within the ethmoidal air cells. Paranasal sinuses are otherwise clear. No mastoid effusion. Middle ear cavities are clear. Salivary glands including the parotid glands and submandibular glands are normal. There is extensive soft tissue swelling with inflammatory stranding involving the shin, extending into the submental region and bilateral submandibular spaces. Inflammatory stranding and induration extends posteriorly towards the carotid spaces posteriorly. Inferior extension down the anterior aspect of the neck. Possible early involvement of the floor of mouth with the floor of mouth musculature somewhat enlarged and edematous in appearance. Tongue appears to be lifted superiorly. There is ill-defined hypodensity with faint rim enhancement at the chin that measures approximately 4.6 x 0.7 x 0.8 cm. Finding compatible with phlegmon/early abscess formation. Slight extension of  phlegmon/abscess more posteriorly within the submental region (series 7, image 52). Superior extension anterior to the right mandibular body (series 3, image 59). Prominent level 1A nodes measure up to 9 mm, likely reactive. No other significant adenopathy within the neck. Oral cavity itself within normal limits. No acute abnormality about the dentition. Palatine tonsils normal. Parapharyngeal fat preserved. Nasopharynx normal. No retropharyngeal fluid collection. Epiglottis normal. Vallecula clear. Hypopharynx and supraglottic larynx within normal limits. True cords symmetric and normal. Subglottic airway grossly clear, although evaluation limited by motion. Thyroid gland grossly unremarkable, although evaluation limited by motion.  Partially visualized mediastinum grossly unremarkable. Visualized lungs are clear. Normal intravascular enhancement seen throughout the neck. No acute osseous abnormality. No worrisome lytic or blastic osseous lesions. 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 Superimposed somewhat ill-defined phlegmon/early abscess at the chin as detailed above. Close clinical monitoring is recommended. Results were called by telephone at the time of interpretation on 09/27/2015 at 11:54 pm to the physician assistant taking care the patient [facility] , who verbally acknowledged these results. Electronically Signed   By: Rise Mu M.D.   On: 09/27/2015 23:56     Scheduled Meds: . FLUoxetine  10 mg Oral QHS  . FLUoxetine  20 mg Oral Daily  . lithium carbonate  150 mg Oral Daily  . mesalamine  2.4 g Oral Q breakfast  . QUEtiapine Fumarate  150 mg Oral QHS  . vancomycin  1,500 mg Intravenous Q12H   Continuous Infusions:    LOS: 0 days   Time Spent in minutes   30 minutes  Danyetta Gillham D.O. on 09/28/2015 at 11:36 AM  Between 7am to 7pm - Pager - [phone]  After 7pm go to [email or web address] - password [email or web address]  And look for the night coverage person covering for me after hours  [facility] Office  [phone]

## 2015-09-28 NOTE — ED Provider Notes (Signed)
Medical screening examination/treatment/procedure(s) were conducted as a shared visit with non-physician practitioner(s) and myself.  I personally evaluated the patient during the encounter.   EKG Interpretation None      20 year old male who presents with neck/chin swelling and redness. Ongoing for past 4 days, with changes in voice (more muffled). No fever chills, throat swelling or sore throat. No dental pain. H/o UC on mesalamine. Has had small area of purulent drainage over skin. Afebrile. Hemodynamically stable. Exam c/f ludwig's angina. Brawny erythema, swelling, tenderness over submandibular region of neck. Blood work with leukocytosis. CT soft tissue of the neck performed. No discrete abscess for drainage, but phlegmon formation and concerning for deep soft tissue neck infection. He is protecting his airway. We'll start IV antibiotics and admit to hospitalist service for ENT consult and ongoing treatment.  CRITICAL CARE Performed by: Lavera Guiseana Duo Jaylise Peek   Total critical care time: 30 minutes  Critical care time was exclusive of separately billable procedures and treating other patients.  Critical care was necessary to treat or prevent imminent or life-threatening deterioration.  Critical care was time spent personally by me on the following activities: development of treatment plan with patient and/or surrogate as well as nursing, discussions with consultants, evaluation of patient's response to treatment, examination of patient, obtaining history from patient or surrogate, ordering and performing treatments and interventions, ordering and review of laboratory studies, ordering and review of radiographic studies, pulse oximetry and re-evaluation of patient's condition.    Lavera Guiseana Duo Elisha Mcgruder, MD 09/28/15 [phone]

## 2015-09-28 NOTE — H&P (Addendum)
History and Physical    VARDAAN DEPASCALE ZOX:[id number] DOB: [birth year] DOA: 09/27/2015  PCP: No PCP Per Patient Consultants:  None Patient coming from: home - lives alone; NOK: Greg Cutter, [phone]  Chief Complaint: swelling under chin  HPI: Jose Bowen is a 20 y.o. male with medical history significant of UC and bipolar disorder presenting with a submental infection.  He reports extreme pain on chin with increasing swelling since Thursday.  Initially thought it was an ingrown hair but he is not sure.  No fevers. Having difficulty swallowing and speaking due to discomfort in chin region. No tongue edema, no throat pain or edema.   ED Course: Per PA-C Gieple:  Patient seen and examined. Work-up initiated. Medications ordered. Feel that given neck symptoms, will need to image the extent of the abscess. Will also give dose of IV antibiotics.  Vital signs reviewed and are as follows: BP 137/66 (BP Location: Right Arm)   Pulse 66   Temp 99.4 F (37.4 C) (Oral)   Resp 14   Ht 5\' 11"  (1.803 m)   Wt 77.1 kg   SpO2 100%   BMI 23.71 kg/m    Spoke with radiologist regarding CT findings. Will admit for monitoring. There does not appear to be any immanent airway compromise or surgical emergency at this time requiring emergent ENT consultation.   Spoke with Dr. Ophelia Charter of [facility] who will see and admit patient.   Pt updated. Exam is stable. He will notify nursing if he feels any worse.   Review of Systems: As per HPI; otherwise 10 point review of systems reviewed and negative.   Ambulatory Status:  ambulates without assistance  Past Medical History:  Diagnosis Date  . Bipolar disorder (HCC)   . Ulcerative colitis (HCC)     History reviewed. No pertinent surgical history.  Social History   Social History  . Marital status: Single    Spouse name: N/A  . Number of children: N/A  . Years of education: N/A   Occupational History  . call center representative -  [employer]    Social History Main Topics  . Smoking status: Never Smoker  . Smokeless tobacco: Never Used  . Alcohol use No  . Drug use:     Types: Marijuana     Comment: occasionally  . Sexual activity: Yes    Partners: Male   Other Topics Concern  . Not on file   Social History Narrative  . No narrative on file    No Known Allergies  Family History  Problem Relation Age of Onset  . Hypertension Mother   . Cancer Mother   . Hypertension Father     Prior to Admission medications   Medication Sig Start Date End Date Taking? Authorizing Provider  FLUoxetine (PROZAC) 20 MG capsule Take 40 mg by mouth 2 (two) times daily. Take 20 mg in the morning and 10 mg at night.    Yes Historical Provider, MD  lithium carbonate 150 MG capsule Take 1 capsule (150 mg total) by mouth daily. Patient taking differently: Take 300 mg by mouth daily.  02/02/15  Yes Gerhard Munch, MD  mesalamine (CANASA) 1000 MG suppository Place 1,000 mg rectally at bedtime as needed. For ulcerative colitis flare ups 05/20/15  Yes Historical Provider, MD  mesalamine (LIALDA) 1.2 g EC tablet Take 2 tablets (2.4 g total) by mouth daily with breakfast. Patient taking differently: Take 2.4 g by mouth 2 (two) times daily.  02/10/15  Yes Geoffery Lyons, MD  QUEtiapine Fumarate (SEROQUEL XR) 150 MG 24 hr tablet Take 150 mg by mouth at bedtime.   Yes Historical Provider, MD    Physical Exam: Vitals:   09/27/15 2134 09/28/15 0008 09/28/15 0105  BP: 137/66 119/71 110/59  Pulse: 66 72 81  Resp: 14 16 20   Temp: 99.4 F (37.4 C) 98.5 F (36.9 C)   TempSrc: Oral Oral   SpO2: 100% 99% 100%  Weight: 77.1 kg (170 lb)    Height: 5\' 11"  (1.803 m)       General: Appears calm and comfortable and is NAD Eyes:  PERRL, EOMI, normal lids, iris ENT:  grossly normal hearing, lips & tongue, mmm; large 4 cm x 12 cm mass throughout the entire submental region; there are a few pustular lesions on it and it is draining a primarily serous  fluid. Neck:  no LAD, masses or thyromegaly but there is mild fullness along the neck anteriorly and it is painful to touch along the SCM muscles Cardiovascular:  RRR, no m/r/g. No LE edema.  Respiratory:  CTA bilaterally, no w/r/r. Normal respiratory effort. Abdomen:  soft, ntnd, NABS Skin:  no rash or induration seen on limited exam other than as described above Musculoskeletal:  grossly normal tone BUE/BLE, good ROM, no bony abnormality Psychiatric:  grossly normal mood and affect, speech fluent and appropriate, AOx3 Neurologic:  CN 2-12 grossly intact, moves all extremities in coordinated fashion, sensation intact  Labs on Admission: I have personally reviewed following labs and imaging studies  CBC:  Recent Labs Lab 09/27/15 2241  WBC 17.4*  NEUTROABS 13.5*  HGB 14.9  HCT 44.0  MCV 82.7  PLT 189   Basic Metabolic Panel:  Recent Labs Lab 09/27/15 2241  NA 137  K 3.4*  CL 102  CO2 27  GLUCOSE 98  BUN 10  CREATININE 0.96  CALCIUM 8.7*   GFR: Estimated Creatinine Clearance: 129.6 mL/min (by C-G formula based on SCr of 0.96 mg/dL). Liver Function Tests: No results for input(s): AST, ALT, ALKPHOS, BILITOT, PROT, ALBUMIN in the last 168 hours. No results for input(s): LIPASE, AMYLASE in the last 168 hours. No results for input(s): AMMONIA in the last 168 hours. Coagulation Profile: No results for input(s): INR, PROTIME in the last 168 hours. Cardiac Enzymes: No results for input(s): CKTOTAL, CKMB, CKMBINDEX, TROPONINI in the last 168 hours. BNP (last 3 results) No results for input(s): PROBNP in the last 8760 hours. HbA1C: No results for input(s): HGBA1C in the last 72 hours. CBG: No results for input(s): GLUCAP in the last 168 hours. Lipid Profile: No results for input(s): CHOL, HDL, LDLCALC, TRIG, CHOLHDL, LDLDIRECT in the last 72 hours. Thyroid Function Tests: No results for input(s): TSH, T4TOTAL, FREET4, T3FREE, THYROIDAB in the last 72 hours. Anemia  Panel: No results for input(s): VITAMINB12, FOLATE, FERRITIN, TIBC, IRON, RETICCTPCT in the last 72 hours. Urine analysis:    Component Value Date/Time   COLORURINE YELLOW 02/02/2015 1916   APPEARANCEUR CLEAR 02/02/2015 1916   LABSPEC >1.046 (H) 02/02/2015 1916   PHURINE 6.0 02/02/2015 1916   GLUCOSEU NEGATIVE 02/02/2015 1916   HGBUR NEGATIVE 02/02/2015 1916   BILIRUBINUR NEGATIVE 02/02/2015 1916   KETONESUR 15 (A) 02/02/2015 1916   PROTEINUR NEGATIVE 02/02/2015 1916   NITRITE NEGATIVE 02/02/2015 1916   LEUKOCYTESUR NEGATIVE 02/02/2015 1916    Creatinine Clearance: Estimated Creatinine Clearance: 129.6 mL/min (by C-G formula based on SCr of 0.96 mg/dL).  Sepsis Labs: @LABRCNTIP (procalcitonin:4,lacticidven:4) )No results found for  this or any previous visit (from the past 240 hour(s)).   Radiological Exams on Admission: Ct Soft Tissue Neck W Contrast  Result Date: 09/27/2015 CLINICAL DATA:  Initial valuation for abscess on left chin. EXAM: CT NECK WITH CONTRAST TECHNIQUE: Multidetector CT imaging of the neck was performed using the standard protocol following the bolus administration of intravenous contrast. CONTRAST:  75mL ISOVUE-300 IOPAMIDOL (ISOVUE-300) INJECTION 61% COMPARISON:  None. FINDINGS: Visualized portions of the brain are unremarkable. Partially visualized globes and orbits within normal limits. Retention cyst present within the right maxillary sinus. Minimal mucosal thickening within the ethmoidal air cells. Paranasal sinuses are otherwise clear. No mastoid effusion. Middle ear cavities are clear. Salivary glands including the parotid glands and submandibular glands are normal. There is extensive soft tissue swelling with inflammatory stranding involving the shin, extending into the submental region and bilateral submandibular spaces. Inflammatory stranding and induration extends posteriorly towards the carotid spaces posteriorly. Inferior extension down the anterior aspect  of the neck. Possible early involvement of the floor of mouth with the floor of mouth musculature somewhat enlarged and edematous in appearance. Tongue appears to be lifted superiorly. There is ill-defined hypodensity with faint rim enhancement at the chin that measures approximately 4.6 x 0.7 x 0.8 cm. Finding compatible with phlegmon/early abscess formation. Slight extension of phlegmon/abscess more posteriorly within the submental region (series 7, image 52). Superior extension anterior to the right mandibular body (series 3, image 59). Prominent level 1A nodes measure up to 9 mm, likely reactive. No other significant adenopathy within the neck. Oral cavity itself within normal limits. No acute abnormality about the dentition. Palatine tonsils normal. Parapharyngeal fat preserved. Nasopharynx normal. No retropharyngeal fluid collection. Epiglottis normal. Vallecula clear. Hypopharynx and supraglottic larynx within normal limits. True cords symmetric and normal. Subglottic airway grossly clear, although evaluation limited by motion. Thyroid gland grossly unremarkable, although evaluation limited by motion. Partially visualized mediastinum grossly unremarkable. Visualized lungs are clear. Normal intravascular enhancement seen throughout the neck. No acute osseous abnormality. No worrisome lytic or blastic osseous lesions. 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 Superimposed somewhat ill-defined phlegmon/early abscess at the chin as detailed above. Close clinical monitoring is recommended. Results were called by telephone at the time of interpretation on 09/27/2015 at 11:54 pm to the physician assistant taking care the [facility] , who verbally acknowledged these results. Electronically Signed   By: Rise MuBenjamin  McClintock M.D.   On:  09/27/2015 23:56    EKG: Not done  Assessment/Plan Active Problems:   Submandibular space infection   Ulcerative colitis (HCC)   Bipolar disorder (HCC)   H/O noncompliance with medical treatment, presenting hazards to health   Marijuana abuse   Submandibular space infection -This is an impressive infection by appearance and by CT, but the patient does not have sepsis physiology at this time and appears otherwise healthy -With his h/o UC, he is at increased risk for immunocompromise and so will cover with Vanc for MRSA and place on contact precautions for now (was given Clindamycin 600 mg IV in ER) -Patient was d/w ENT Dr. Pollyann Kennedyosen - does not appear to have Ludwig's angina in that there is no apparent floor of mouth involvement.  Instead, this is more c/w cellulitis.  There is also no well-defined abscess that would be amenable to draining at this time.  Dr. Pollyann Kennedyosen will see patient in AM. -Will  place in observation, med surg unit. -Pain control with Tylenol, Oxycodone, Toradol as needed. -Will check UDS -Will check HIV (reports last test was several months ago but there is no such record in our system and patient is at increased risk due to his sexual orientation). -Wound culture requested by ER PA, if possible.  UC -Patient is prescribed Mesalamine 2.4 grams/day (plus PR mesalamine as needed). -He has been taking this BID. -Last dose was 1 1/2 weeks ago, as patient no longer has a PCP. -In review of the dosing instructions, it appears that once daily dosing for maintenance is the appropriate dose; will resume this dosing regimen (as was previously prescribed).  Bipolar Disorder -Patient did not report this as a diagnosis when we reviewed his PMH, but given his medication list this is almost certainly the diagnosis he has been given in the past. -Once again, he is taking his prescriptions differently than prescribed and last took them 1 1/2 weeks ago. -Will resume his prior prescribed  doses of Prozac 40 mg BID; Lithium 150 mg daily; and Seroquel XR 150 mg qhs.  Medical non-compliance -This patient has 2 current chronic medical conditions that require close monitoring. -He also has multiple prescriptions for controlling medications that are essential to him. -He has disassociated himself with his PCP and has not found a new one. -He also has apparently adjusted the doses of his medications without physician input. -Finally, he has not taken his medications in the last 1 1/2 weeks. -These behaviors are all of concern; the patient needs to be educated about the importance of compliance and also needs to find a new PCP.  Marijuana abuse -Patient acknowledges occasional marijuana abuse -Given his mental health history and need for multiple psychotropic medications, it would be in his best interests to abstain.   DVT prophylaxis: Early ambulation Code Status:  Full  Family Communication: None present and patient does not list family as his NOK  Disposition Plan:  Home once clinically improved Consults called: ENT - to see in AM  Admission status: Observation - Med Surg    Jonah Blue MD [facility]  If 7PM-7AM, please contact night-coverage [email or web address] Password [email or web address]  09/28/2015, 1:32 AM

## 2015-09-28 NOTE — Consult Note (Signed)
Reason for Consult:Neck swelling Referring Physician: Cristal Ford, DO  Jose Bowen is an 20 y.o. male.  HPI: He developed swelling under the chin Thursday. It has progressed and he was admitted to hospital yesterday. He thinks he may have cut himself shaving. He hs never had MRSA.  Past Medical History:  Diagnosis Date  . Bipolar disorder ([place])   . Ulcerative colitis ([place])     History reviewed. No pertinent surgical history.  Family History  Problem Relation Age of Onset  . Hypertension Mother   . Cancer Mother   . Hypertension Father     Social History:  reports that he has never smoked. He has never used smokeless tobacco. He reports that he uses drugs, including Marijuana. He reports that he does not drink alcohol.  Allergies: No Known Allergies  Medications: Reviewed  Results for orders placed or performed during the hospital encounter of 09/27/15 (from the past 48 hour(s))  CBC with Differential/Platelet     Status: Abnormal   Collection Time: 09/27/15 10:41 PM  Result Value Ref Range   WBC 17.4 (H) 4.0 - 10.5 K/uL   RBC 5.32 4.22 - 5.81 MIL/uL   Hemoglobin 14.9 13.0 - 17.0 g/dL   HCT 44.0 39.0 - 52.0 %   MCV 82.7 78.0 - 100.0 fL   MCH 28.0 26.0 - 34.0 pg   MCHC 33.9 30.0 - 36.0 g/dL   RDW 13.3 11.5 - 15.5 %   Platelets 189 150 - 400 K/uL   Neutrophils Relative % 77 %   Neutro Abs 13.5 (H) 1.7 - 7.7 K/uL   Lymphocytes Relative 11 %   Lymphs Abs 1.9 0.7 - 4.0 K/uL   Monocytes Relative 8 %   Monocytes Absolute 1.4 (H) 0.1 - 1.0 K/uL   Eosinophils Relative 4 %   Eosinophils Absolute 0.6 0.0 - 0.7 K/uL   Basophils Relative 0 %   Basophils Absolute 0.0 0.0 - 0.1 K/uL  Basic metabolic panel     Status: Abnormal   Collection Time: 09/27/15 10:41 PM  Result Value Ref Range   Sodium 137 135 - 145 mmol/L   Potassium 3.4 (L) 3.5 - 5.1 mmol/L   Chloride 102 101 - 111 mmol/L   CO2 27 22 - 32 mmol/L   Glucose, Bld 98 65 - 99 mg/dL   BUN 10 6 - 20 mg/dL    Creatinine, Ser 0.96 0.61 - 1.24 mg/dL   Calcium 8.7 (L) 8.9 - 10.3 mg/dL   GFR calc non Af Amer >60 >60 mL/min   GFR calc Af Amer >60 >60 mL/min    Comment: (NOTE) The eGFR has been calculated using the CKD EPI equation. This calculation has not been validated in all clinical situations. eGFR's persistently <60 mL/min signify possible Chronic Kidney Disease.    Anion gap 8 5 - 15  Urine rapid drug screen (hosp performed)     Status: Abnormal   Collection Time: 09/28/15  1:18 AM  Result Value Ref Range   Opiates NONE DETECTED NONE DETECTED   Cocaine NONE DETECTED NONE DETECTED   Benzodiazepines NONE DETECTED NONE DETECTED   Amphetamines NONE DETECTED NONE DETECTED   Tetrahydrocannabinol POSITIVE (A) NONE DETECTED   Barbiturates NONE DETECTED NONE DETECTED    Comment:        DRUG SCREEN FOR MEDICAL PURPOSES ONLY.  IF CONFIRMATION IS NEEDED FOR ANY PURPOSE, NOTIFY LAB WITHIN 5 DAYS.        LOWEST DETECTABLE LIMITS FOR URINE DRUG SCREEN  Drug Class       Cutoff (ng/mL) Amphetamine      1000 Barbiturate      200 Benzodiazepine   540 Tricyclics       981 Opiates          300 Cocaine          300 THC              50   Basic metabolic panel     Status: Abnormal   Collection Time: 09/28/15  5:00 AM  Result Value Ref Range   Sodium 138 135 - 145 mmol/L   Potassium 3.6 3.5 - 5.1 mmol/L   Chloride 106 101 - 111 mmol/L   CO2 26 22 - 32 mmol/L   Glucose, Bld 91 65 - 99 mg/dL   BUN 8 6 - 20 mg/dL   Creatinine, Ser 0.86 0.61 - 1.24 mg/dL   Calcium 8.5 (L) 8.9 - 10.3 mg/dL   GFR calc non Af Amer >60 >60 mL/min   GFR calc Af Amer >60 >60 mL/min    Comment: (NOTE) The eGFR has been calculated using the CKD EPI equation. This calculation has not been validated in all clinical situations. eGFR's persistently <60 mL/min signify possible Chronic Kidney Disease.    Anion gap 6 5 - 15  CBC     Status: Abnormal   Collection Time: 09/28/15  5:00 AM  Result Value Ref Range   WBC 16.7  (H) 4.0 - 10.5 K/uL   RBC 4.98 4.22 - 5.81 MIL/uL   Hemoglobin 13.9 13.0 - 17.0 g/dL   HCT 42.4 39.0 - 52.0 %   MCV 85.1 78.0 - 100.0 fL   MCH 27.9 26.0 - 34.0 pg   MCHC 32.8 30.0 - 36.0 g/dL   RDW 13.6 11.5 - 15.5 %   Platelets 180 150 - 400 K/uL    Ct Soft Tissue Neck W Contrast  Result Date: 09/27/2015 CLINICAL DATA:  Initial valuation for abscess on left chin. EXAM: CT NECK WITH CONTRAST TECHNIQUE: Multidetector CT imaging of the neck was performed using the standard protocol following the bolus administration of intravenous contrast. CONTRAST:  42m ISOVUE-300 IOPAMIDOL (ISOVUE-300) INJECTION 61% COMPARISON:  None. FINDINGS: Visualized portions of the brain are unremarkable. Partially visualized globes and orbits within normal limits. Retention cyst present within the right maxillary sinus. Minimal mucosal thickening within the ethmoidal air cells. Paranasal sinuses are otherwise clear. No mastoid effusion. Middle ear cavities are clear. Salivary glands including the parotid glands and submandibular glands are normal. There is extensive soft tissue swelling with inflammatory stranding involving the shin, extending into the submental region and bilateral submandibular spaces. Inflammatory stranding and induration extends posteriorly towards the carotid spaces posteriorly. Inferior extension down the anterior aspect of the neck. Possible early involvement of the floor of mouth with the floor of mouth musculature somewhat enlarged and edematous in appearance. Tongue appears to be lifted superiorly. There is ill-defined hypodensity with faint rim enhancement at the chin that measures approximately 4.6 x 0.7 x 0.8 cm. Finding compatible with phlegmon/early abscess formation. Slight extension of phlegmon/abscess more posteriorly within the submental region (series 7, image 52). Superior extension anterior to the right mandibular body (series 3, image 59). Prominent level 1A nodes measure up to 9 mm,  likely reactive. No other significant adenopathy within the neck. Oral cavity itself within normal limits. No acute abnormality about the dentition. Palatine tonsils normal. Parapharyngeal fat preserved. Nasopharynx normal. No retropharyngeal fluid collection. Epiglottis normal.  Vallecula clear. Hypopharynx and supraglottic larynx within normal limits. True cords symmetric and normal. Subglottic airway grossly clear, although evaluation limited by motion. Thyroid gland grossly unremarkable, although evaluation limited by motion. Partially visualized mediastinum grossly unremarkable. Visualized lungs are clear. Normal intravascular enhancement seen throughout the neck. No acute osseous abnormality. No worrisome lytic or blastic osseous lesions. 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 Superimposed somewhat ill-defined phlegmon/early abscess at the chin as detailed above. Close clinical monitoring is recommended. Results were called by telephone at the time of interpretation on 09/27/2015 at 11:54 pm to the physician assistant taking care the patient [facility] , who verbally acknowledged these results. Electronically Signed   By: Jeannine Boga M.D.   On: 09/27/2015 23:56    FFK:VQOHCOBT except as listed in admit H&P  Blood pressure (!) 108/55, pulse 76, temperature 98.8 F (37.1 C), temperature source Oral, resp. rate 16, height _0  (1.803 m), weight 77.1 kg (170 lb), SpO2 100 %.  PHYSICAL EXAM: Overall appearance:  Healthy appearing, in no distress. Head:  Normocephalic, atraumatic. Ears: External ears look healthy. Nose: External nose is healthy in appearance. Oral Cavity/Pharynx:  There are no mucosal lesions or masses identified.Dentition appears healthy. Floor of mouth reveals no swelling. Larynx/Hypopharynx:  Deferred Neuro:  No identifiable neurologic deficits. Neck: No palpable neck masses. Submental swelling, firmness, not fluctuant. Small drainage site to the left of midline.  Studies Reviewed: CT neck  Procedures: none   Assessment/Plan: Skin infection, no signs of Ludwig's angina. Continue with IV Abx, Unlikely that he will require surgical drainage.   Felisa Zechman 09/28/2015, 10:58 AM

## 2015-09-28 NOTE — Progress Notes (Signed)
Jose EllisJustin C Diel is a 20 y.o. male patient admitted from ED awake, alert - oriented  X 4 - no acute distress noted.  VSS - Blood pressure 124/63, pulse 61, temperature 99.2 F (37.3 C), temperature source Oral, resp. rate 18, height 5\' 11"  (1.803 m), weight 77.1 kg (170 lb), SpO2 100 %.    IV in place, occlusive dsg intact without redness.  Orientation to room, and floor completed with information packet given to patient/family.  Patient declined safety video at this time.  Admission INP armband ID verified with patient/family, and in place.   SR up x 2, fall assessment complete, with patient and family able to verbalize understanding of risk associated with falls, and verbalized understanding to call nsg before up out of bed.  Call light within reach, patient able to voice, and demonstrate understanding.  Skin, clean-dry- intact without evidence of bruising, or skin tears.   No evidence of skin break down noted on exam.     Will cont to eval and treat per MD orders.  Elissa HeftyPatricia D Ramirez, RN 09/28/2015 2:08 AM

## 2015-09-29 DIAGNOSIS — Z9119 Patient's noncompliance with other medical treatment and regimen: Secondary | ICD-10-CM

## 2015-09-29 DIAGNOSIS — K122 Cellulitis and abscess of mouth: Secondary | ICD-10-CM

## 2015-09-29 DIAGNOSIS — F1210 Cannabis abuse, uncomplicated: Secondary | ICD-10-CM

## 2015-09-29 LAB — CBC
HCT: 39.5 % (ref 39.0–52.0)
Hemoglobin: 13.2 g/dL (ref 13.0–17.0)
MCH: 28 pg (ref 26.0–34.0)
MCHC: 33.4 g/dL (ref 30.0–36.0)
MCV: 83.7 fL (ref 78.0–100.0)
PLATELETS: 178 10*3/uL (ref 150–400)
RBC: 4.72 MIL/uL (ref 4.22–5.81)
RDW: 13.3 % (ref 11.5–15.5)
WBC: 12.8 10*3/uL — ABNORMAL HIGH (ref 4.0–10.5)

## 2015-09-29 LAB — BASIC METABOLIC PANEL
Anion gap: 7 (ref 5–15)
CALCIUM: 8.4 mg/dL — AB (ref 8.9–10.3)
CO2: 26 mmol/L (ref 22–32)
CREATININE: 0.87 mg/dL (ref 0.61–1.24)
Chloride: 106 mmol/L (ref 101–111)
Glucose, Bld: 87 mg/dL (ref 65–99)
Potassium: 3.7 mmol/L (ref 3.5–5.1)
SODIUM: 139 mmol/L (ref 135–145)

## 2015-09-29 MED ORDER — IBUPROFEN 200 MG PO TABS
600.0000 mg | ORAL_TABLET | Freq: Four times a day (QID) | ORAL | Status: DC | PRN
  Administered 2015-09-29: 600 mg via ORAL
  Filled 2015-09-29: qty 3

## 2015-09-29 NOTE — Progress Notes (Signed)
PROGRESS NOTE    Jose Bowen  ZOX:[id number] DOB: [birth year] DOA: 09/27/2015 PCP: No PCP Per Patient   Chief Complaint  Patient presents with  . Abscess   Brief Narrative:  HPI on 09/28/2015 by Dr. Crista Elliot is a 20 y.o. male with medical history significant of UC and bipolar disorder presenting with a submental infection.  He reports extreme pain on chin with increasing swelling since Thursday.  Initially thought it was an ingrown hair but he is not sure.  No fevers. Having difficulty swallowing and speaking due to discomfort in chin region. No tongue edema, no throat pain or edema.  Assessment & Plan   Submandibular cellulitis/?abscess -CT Soft tissue neck: Extensive inflammatory stranding and swelling and induration at the chin with extension into the submental region and bilateral submandibular spaces -ENT Surgery consulted and appreciated- continue IV antibiotics, unlikely that it will need surgical drainiange -Continue vancomycin  Leukocytosis -Improving, secondary to the above -Continue to monitor CBC  Ulcerative colitis -Continue mesalamine -Follow-up with gastroenterologist or PCP upon discharge  ?Bipolar disorder -Continue Prozac, lithium, Seroquel  Medical noncompliance -Will need primary care physician, will consult case management  Marijuana abuse -Toxicology screen positive for marijuana -Discussed cessation  DVT Prophylaxis  Early ambulation/SCDs  Code Status: Full  Family Communication: None at bedside  Disposition Plan: Inpatient, Will continue IV antibiotics  Consultants ENT, Dr. Pollyann Kennedy  Procedures  None  Antibiotics   Anti-infectives    Start     Dose/Rate Route Frequency Ordered Stop   09/28/15 0300  vancomycin (VANCOCIN) 1,500 mg in sodium chloride 0.9 % 500 mL IVPB     1,500 mg 250 mL/hr over 120 Minutes Intravenous Every 12 hours 09/28/15 0223     09/27/15 2245  clindamycin (CLEOCIN) IVPB 600 mg     600 mg 100  mL/hr over 30 Minutes Intravenous  Once 09/27/15 2230 09/28/15 0042      Subjective:   Jose Bowen seen and examined today. Patient complains of pain, 9/10, constant ache.  Also states that it interferes with his swallowing and chewing. Feels left side has less edema.  Denies chest pain, shortness of breath, nausea, vomiting, abdominal pain, headache.  Objective:   Vitals:   09/28/15 0541 09/28/15 1432 09/28/15 2339 09/29/15 0652  BP: (!) 108/55 (!) 114/56 (!) 104/48 (!) 110/52  Pulse: 76 62 61 68  Resp: 16 20 18 18   Temp: 98.8 F (37.1 C) 99.6 F (37.6 C) 99.1 F (37.3 C) 99.3 F (37.4 C)  TempSrc: Oral Oral Oral Oral  SpO2: 100% 99% 98% 99%  Weight:      Height:        Intake/Output Summary (Last 24 hours) at 09/29/15 1218 Last data filed at 09/29/15 0600  Gross per 24 hour  Intake             2320 ml  Output                0 ml  Net             2320 ml   Filed Weights   09/27/15 2134  Weight: 77.1 kg (170 lb)    Exam  General: Well developed, well nourished, NAD, appears stated age  HEENT: NCAT, mucous membranes moist. Submandibular mass, small amount of discharge, edema slightly improving.   Cardiovascular: S1 S2 auscultated, no murmurs, RRR  Respiratory: Clear to auscultation bilaterally with equal chest rise  Abdomen: Soft, nontender, nondistended, + bowel sounds  Extremities: warm dry without cyanosis clubbing or edema  Neuro: AAOx3, nonfocal   Psych: Normal affect and demeanor, pleasant   Data Reviewed: I have personally reviewed following labs and imaging studies  CBC:  Recent Labs Lab 09/27/15 2241 09/28/15 0500 09/29/15 0430  WBC 17.4* 16.7* 12.8*  NEUTROABS 13.5*  --   --   HGB 14.9 13.9 13.2  HCT 44.0 42.4 39.5  MCV 82.7 85.1 83.7  PLT 189 180 178   Basic Metabolic Panel:  Recent Labs Lab 09/27/15 2241 09/28/15 0500 09/29/15 0430  NA 137 138 139  K 3.4* 3.6 3.7  CL 102 106 106  CO2 27 26 26   GLUCOSE 98 91 87  BUN 10  8 <5*  CREATININE 0.96 0.86 0.87  CALCIUM 8.7* 8.5* 8.4*   GFR: Estimated Creatinine Clearance: 143.1 mL/min (by C-G formula based on SCr of 0.87 mg/dL). Liver Function Tests: No results for input(s): AST, ALT, ALKPHOS, BILITOT, PROT, ALBUMIN in the last 168 hours. No results for input(s): LIPASE, AMYLASE in the last 168 hours. No results for input(s): AMMONIA in the last 168 hours. Coagulation Profile: No results for input(s): INR, PROTIME in the last 168 hours. Cardiac Enzymes: No results for input(s): CKTOTAL, CKMB, CKMBINDEX, TROPONINI in the last 168 hours. BNP (last 3 results) No results for input(s): PROBNP in the last 8760 hours. HbA1C: No results for input(s): HGBA1C in the last 72 hours. CBG: No results for input(s): GLUCAP in the last 168 hours. Lipid Profile: No results for input(s): CHOL, HDL, LDLCALC, TRIG, CHOLHDL, LDLDIRECT in the last 72 hours. Thyroid Function Tests: No results for input(s): TSH, T4TOTAL, FREET4, T3FREE, THYROIDAB in the last 72 hours. Anemia Panel: No results for input(s): VITAMINB12, FOLATE, FERRITIN, TIBC, IRON, RETICCTPCT in the last 72 hours. Urine analysis:    Component Value Date/Time   COLORURINE YELLOW 02/02/2015 1916   APPEARANCEUR CLEAR 02/02/2015 1916   LABSPEC >1.046 (H) 02/02/2015 1916   PHURINE 6.0 02/02/2015 1916   GLUCOSEU NEGATIVE 02/02/2015 1916   HGBUR NEGATIVE 02/02/2015 1916   BILIRUBINUR NEGATIVE 02/02/2015 1916   KETONESUR 15 (A) 02/02/2015 1916   PROTEINUR NEGATIVE 02/02/2015 1916   NITRITE NEGATIVE 02/02/2015 1916   LEUKOCYTESUR NEGATIVE 02/02/2015 1916   Sepsis Labs: @LABRCNTIP (procalcitonin:4,lacticidven:4)  ) Recent Results (from the past 240 hour(s))  Wound or Superficial Culture     Status: None (Preliminary result)   Collection Time: 09/28/15  1:06 AM  Result Value Ref Range Status   Specimen Description FACE  Final   Special Requests Normal  Final   Gram Stain   Final    NO WBC SEEN FEW GRAM  POSITIVE COCCI IN CLUSTERS [facility]    Culture PENDING  Incomplete   Report Status PENDING  Incomplete      Radiology Studies: Ct Soft Tissue Neck W Contrast  Result Date: 09/27/2015 CLINICAL DATA:  Initial valuation for abscess on left chin. EXAM: CT NECK WITH CONTRAST TECHNIQUE: Multidetector CT imaging of the neck was performed using the standard protocol following the bolus administration of intravenous contrast. CONTRAST:  75mL ISOVUE-300 IOPAMIDOL (ISOVUE-300) INJECTION 61% COMPARISON:  None. FINDINGS: Visualized portions of the brain are unremarkable. Partially visualized globes and orbits within normal limits. Retention cyst present within the right maxillary sinus. Minimal mucosal thickening within the ethmoidal air cells. Paranasal sinuses are otherwise clear. No mastoid effusion. Middle ear cavities are clear. Salivary glands including the parotid glands and submandibular glands are normal. There is extensive soft tissue  swelling with inflammatory stranding involving the shin, extending into the submental region and bilateral submandibular spaces. Inflammatory stranding and induration extends posteriorly towards the carotid spaces posteriorly. Inferior extension down the anterior aspect of the neck. Possible early involvement of the floor of mouth with the floor of mouth musculature somewhat enlarged and edematous in appearance. Tongue appears to be lifted superiorly. There is ill-defined hypodensity with faint rim enhancement at the chin that measures approximately 4.6 x 0.7 x 0.8 cm. Finding compatible with phlegmon/early abscess formation. Slight extension of phlegmon/abscess more posteriorly within the submental region (series 7, image 52). Superior extension anterior to the right mandibular body (series 3, image 59). Prominent level 1A nodes measure up to 9 mm, likely reactive. No other significant adenopathy within the neck. Oral cavity itself within normal limits.  No acute abnormality about the dentition. Palatine tonsils normal. Parapharyngeal fat preserved. Nasopharynx normal. No retropharyngeal fluid collection. Epiglottis normal. Vallecula clear. Hypopharynx and supraglottic larynx within normal limits. True cords symmetric and normal. Subglottic airway grossly clear, although evaluation limited by motion. Thyroid gland grossly unremarkable, although evaluation limited by motion. Partially visualized mediastinum grossly unremarkable. Visualized lungs are clear. Normal intravascular enhancement seen throughout the neck. No acute osseous abnormality. No worrisome lytic or blastic osseous lesions. 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 Superimposed somewhat ill-defined phlegmon/early abscess at the chin as detailed above. Close clinical monitoring is recommended. Results were called by telephone at the time of interpretation on 09/27/2015 at 11:54 pm to the physician assistant taking care the patient [facility] , who verbally acknowledged these results. Electronically Signed   By: Rise Mu M.D.   On: 09/27/2015 23:56     Scheduled Meds: . FLUoxetine  10 mg Oral QHS  . FLUoxetine  20 mg Oral Daily  . lithium carbonate  150 mg Oral Daily  . mesalamine  2.4 g Oral Q breakfast  . QUEtiapine Fumarate  150 mg Oral QHS  . vancomycin  1,500 mg Intravenous Q12H   Continuous Infusions:    LOS: 1 day   Time Spent in minutes   30 minutes  Dorenda Pfannenstiel D.O. on 09/29/2015 at 12:18 PM  Between 7am to 7pm - Pager - [phone]  After 7pm go to [email or web address] - password [email or web address]  And look for the night coverage person covering for me after hours  [facility] Office  [phone]

## 2015-09-30 DIAGNOSIS — K51919 Ulcerative colitis, unspecified with unspecified complications: Secondary | ICD-10-CM

## 2015-09-30 DIAGNOSIS — F3170 Bipolar disorder, currently in remission, most recent episode unspecified: Secondary | ICD-10-CM

## 2015-09-30 LAB — AEROBIC CULTURE W GRAM STAIN (SUPERFICIAL SPECIMEN)
Gram Stain: NONE SEEN
Special Requests: NORMAL

## 2015-09-30 LAB — CBC
HCT: 40 % (ref 39.0–52.0)
Hemoglobin: 13.1 g/dL (ref 13.0–17.0)
MCH: 28.1 pg (ref 26.0–34.0)
MCHC: 32.8 g/dL (ref 30.0–36.0)
MCV: 85.7 fL (ref 78.0–100.0)
PLATELETS: 193 10*3/uL (ref 150–400)
RBC: 4.67 MIL/uL (ref 4.22–5.81)
RDW: 13.2 % (ref 11.5–15.5)
WBC: 8.6 10*3/uL (ref 4.0–10.5)

## 2015-09-30 LAB — BASIC METABOLIC PANEL WITH GFR
Anion gap: 7 (ref 5–15)
BUN: <5 mg/dL — ABNORMAL LOW (ref 6–20)
CO2: 26 mmol/L (ref 22–32)
Calcium: 8.5 mg/dL — ABNORMAL LOW (ref 8.9–10.3)
Chloride: 106 mmol/L (ref 101–111)
Creatinine, Ser: 0.82 mg/dL (ref 0.61–1.24)
GFR calc Af Amer: >60 mL/min
GFR calc non Af Amer: >60 mL/min
Glucose, Bld: 83 mg/dL (ref 65–99)
Potassium: 3.3 mmol/L — ABNORMAL LOW (ref 3.5–5.1)
Sodium: 139 mmol/L (ref 135–145)

## 2015-09-30 LAB — VANCOMYCIN, TROUGH: Vancomycin Tr: 10 ug/mL — ABNORMAL LOW (ref 15–20)

## 2015-09-30 MED ORDER — POTASSIUM CHLORIDE CRYS ER 20 MEQ PO TBCR
40.0000 meq | EXTENDED_RELEASE_TABLET | Freq: Once | ORAL | Status: AC
  Administered 2015-09-30: 40 meq via ORAL
  Filled 2015-09-30: qty 2

## 2015-09-30 NOTE — Progress Notes (Addendum)
Pharmacy Antibiotic Note  Jose Bowen is a 20 y.o. male admitted on 09/27/2015 with cellulitis of the submandibular space.  In the ED patient received Clindamycin 600mg  IV x 1 dose.  Pharmacy consulted for Vancomycin dosing.  Plan: Vancomycin 1500mg  IV q12h-   - Vancomycin Trough Goal: 10-15 mcg/ml Wound Cx: MRSA Renal function wnl Check vancomycin trough level today at 1400, prior to 1500 dose - Vanc trough in desired range, possible discharge 9/13  Height: 5\' 11"  (180.3 cm) Weight: 170 lb (77.1 kg) IBW/kg (Calculated) : 75.3  Temp (24hrs), Avg:98.8 F (37.1 C), Min:97.9 F (36.6 C), Max:99.5 F (37.5 C)   Recent Labs Lab 09/27/15 2241 09/28/15 0500 09/29/15 0430 09/30/15 0504  WBC 17.4* 16.7* 12.8* 8.6  CREATININE 0.96 0.86 0.87 0.82    Estimated Creatinine Clearance: 151.8 mL/min (by C-G formula based on SCr of 0.82 mg/dL).    No Known Allergies  Antimicrobials this admission: 9/9 Clindamycin x 1 dose 9/10 Vanc >>    Dose adjustments this admission: 9/12 Vanc trough at 1400 = 10 mcg/ml  Microbiology results: 9/9 Wound Cx:    Thank you for allowing pharmacy to be a part of this patient's care.  Otho BellowsGreen, Lakevia Perris L PharmD Pager [phone] 09/30/2015, 11:19 AM

## 2015-09-30 NOTE — Progress Notes (Signed)
Spoke with patient at bedside regarding referral for PCP. Patient states he does not ever go to a doctor for regular medical issues, he does say he uses [facility] physician [facility], sees a gastroenterologist there but can not remember his name. Patient states he has insurance through [employer], encouraged him to provide the contact information to admissions so his stay would be billed properly. He has no issues obtaining medication or affording them. Discussed need for follow up care, obtaining PCP by calling the customer care number on his insurance card. He understood and was appreciative of information. No other d/c concerns identified.

## 2015-09-30 NOTE — Progress Notes (Signed)
PROGRESS NOTE    Jose Bowen  IHK:[id number] DOB: Oct 23, 1995 DOA: 09/27/2015 PCP: No PCP Per Patient   Chief Complaint  Patient presents with  . Abscess   Brief Narrative:  HPI on 09/28/2015 by Dr. Zara Chess is a 20 y.o. male with medical history significant of UC and bipolar disorder presenting with a submental infection.  He reports extreme pain on chin with increasing swelling since Thursday.  Initially thought it was an ingrown hair but he is not sure.  No fevers. Having difficulty swallowing and speaking due to discomfort in chin region. No tongue edema, no throat pain or edema.  Assessment & Plan   Submandibular cellulitis/?abscess -CT Soft tissue neck: Extensive inflammatory stranding and swelling and induration at the chin with extension into the submental region and bilateral submandibular spaces -ENT Surgery consulted and appreciated- continue IV antibiotics, unlikely that it will need surgical drainiange -Continue vancomycin -Currently draining, but edema improving  Leukocytosis -Resolved, secondary to the above -Continue to monitor CBC  Ulcerative colitis -Continue mesalamine -Follow-up with gastroenterologist or PCP upon discharge  ?Bipolar disorder -Continue Prozac, lithium, Seroquel  Medical noncompliance -Will need primary care physician, Case management met with patient.  Encouraged him to call the number on his insurance card to find PCP  Marijuana abuse -Toxicology screen positive for marijuana -Discussed cessation  DVT Prophylaxis  Early ambulation/SCDs  Code Status: Full  Family Communication: None at bedside  Disposition Plan: Inpatient, Will continue IV antibiotics. Likely discharge 9/13  Consultants ENT, Dr. Constance Holster  Procedures  None  Antibiotics   Anti-infectives    Start     Dose/Rate Route Frequency Ordered Stop   09/28/15 0300  vancomycin (VANCOCIN) 1,500 mg in sodium chloride 0.9 % 500 mL IVPB     1,500  mg 250 mL/hr over 120 Minutes Intravenous Every 12 hours 09/28/15 0223     09/27/15 2245  clindamycin (CLEOCIN) IVPB 600 mg     600 mg 100 mL/hr over 30 Minutes Intravenous  Once 09/27/15 2230 09/28/15 0042      Subjective:   Jose Bowen seen and examined today. Feels his swelling and pain have improved. Has noticed some drainage this morning.  Denies chest pain, shortness of breath, nausea, vomiting, abdominal pain, headache.  Objective:   Vitals:   09/29/15 0652 09/29/15 1423 09/29/15 2232 09/30/15 0615  BP: (!) 110/52 114/71 126/74 110/71  Pulse: 68 60 (!) 57 (!) 56  Resp: 18 18 18 16   Temp: 99.3 F (37.4 C) 98.9 F (37.2 C) 99.5 F (37.5 C) 97.9 F (36.6 C)  TempSrc: Oral Oral Oral Oral  SpO2: 99% 100% 100% 100%  Weight:      Height:        Intake/Output Summary (Last 24 hours) at 09/30/15 1351 Last data filed at 09/29/15 2233  Gross per 24 hour  Intake              860 ml  Output                0 ml  Net              860 ml   Filed Weights   09/27/15 2134  Weight: 77.1 kg (170 lb)    Exam  General: Well developed, well nourished, NAD  HEENT: NCAT, mucous membranes moist. Submandibular mass, small amount of discharge, edema improving.   Cardiovascular: S1 S2 auscultated, no murmurs, RRR  Respiratory: Clear to auscultation bilaterally with equal chest  rise  Abdomen: Soft, nontender, nondistended, + bowel sounds  Extremities: warm dry without cyanosis clubbing or edema  Neuro: AAOx3, nonfocal   Psych: Normal affect and demeanor, pleasant   Data Reviewed: I have personally reviewed following labs and imaging studies  CBC:  Recent Labs Lab 09/27/15 2241 09/28/15 0500 09/29/15 0430 09/30/15 0504  WBC 17.4* 16.7* 12.8* 8.6  NEUTROABS 13.5*  --   --   --   HGB 14.9 13.9 13.2 13.1  HCT 44.0 42.4 39.5 40.0  MCV 82.7 85.1 83.7 85.7  PLT 189 180 178 546   Basic Metabolic Panel:  Recent Labs Lab 09/27/15 2241 09/28/15 0500 09/29/15 0430  09/30/15 0504  NA 137 138 139 139  K 3.4* 3.6 3.7 3.3*  CL 102 106 106 106  CO2 27 26 26 26   GLUCOSE 98 91 87 83  BUN 10 8 <5* <5*  CREATININE 0.96 0.86 0.87 0.82  CALCIUM 8.7* 8.5* 8.4* 8.5*   GFR: Estimated Creatinine Clearance: 151.8 mL/min (by C-G formula based on SCr of 0.82 mg/dL). Liver Function Tests: No results for input(s): AST, ALT, ALKPHOS, BILITOT, PROT, ALBUMIN in the last 168 hours. No results for input(s): LIPASE, AMYLASE in the last 168 hours. No results for input(s): AMMONIA in the last 168 hours. Coagulation Profile: No results for input(s): INR, PROTIME in the last 168 hours. Cardiac Enzymes: No results for input(s): CKTOTAL, CKMB, CKMBINDEX, TROPONINI in the last 168 hours. BNP (last 3 results) No results for input(s): PROBNP in the last 8760 hours. HbA1C: No results for input(s): HGBA1C in the last 72 hours. CBG: No results for input(s): GLUCAP in the last 168 hours. Lipid Profile: No results for input(s): CHOL, HDL, LDLCALC, TRIG, CHOLHDL, LDLDIRECT in the last 72 hours. Thyroid Function Tests: No results for input(s): TSH, T4TOTAL, FREET4, T3FREE, THYROIDAB in the last 72 hours. Anemia Panel: No results for input(s): VITAMINB12, FOLATE, FERRITIN, TIBC, IRON, RETICCTPCT in the last 72 hours. Urine analysis:    Component Value Date/Time   COLORURINE YELLOW 02/02/2015 1916   APPEARANCEUR CLEAR 02/02/2015 1916   LABSPEC >1.046 (H) 02/02/2015 1916   PHURINE 6.0 02/02/2015 1916   GLUCOSEU NEGATIVE 02/02/2015 1916   HGBUR NEGATIVE 02/02/2015 1916   [place] [address] [place] NEGATIVE 02/02/2015 1916   KETONESUR 15 (A) 02/02/2015 1916   PROTEINUR NEGATIVE 02/02/2015 1916   NITRITE NEGATIVE 02/02/2015 1916   LEUKOCYTESUR NEGATIVE 02/02/2015 1916   Sepsis Labs: @LABRCNTIP (procalcitonin:4,lacticidven:4)  ) Recent Results (from the past 240 hour(s))  Wound or Superficial Culture     Status: None   Collection Time: 09/28/15  1:06 AM  Result Value Ref Range Status    Specimen Description FACE  Final   Special Requests Normal  Final   Gram Stain   Final    NO WBC SEEN FEW GRAM POSITIVE COCCI IN CLUSTERS Performed at [facility]    Culture   Final    MODERATE METHICILLIN RESISTANT STAPHYLOCOCCUS AUREUS   Report Status 09/30/2015 FINAL  Final   Organism ID, Bacteria METHICILLIN RESISTANT STAPHYLOCOCCUS AUREUS  Final      Susceptibility   Methicillin resistant staphylococcus aureus - MIC*    CIPROFLOXACIN <=0.5 SENSITIVE Sensitive     ERYTHROMYCIN >=8 RESISTANT Resistant     GENTAMICIN <=0.5 SENSITIVE Sensitive     OXACILLIN >=4 RESISTANT Resistant     TETRACYCLINE <=1 SENSITIVE Sensitive     VANCOMYCIN <=0.5 SENSITIVE Sensitive     TRIMETH/SULFA <=10 SENSITIVE Sensitive     CLINDAMYCIN <=0.25 SENSITIVE  Sensitive     RIFAMPIN <=0.5 SENSITIVE Sensitive     Inducible Clindamycin NEGATIVE Sensitive     * MODERATE METHICILLIN RESISTANT STAPHYLOCOCCUS AUREUS      Radiology Studies: No results found.   Scheduled Meds: . FLUoxetine  10 mg Oral QHS  . FLUoxetine  20 mg Oral Daily  . lithium carbonate  150 mg Oral Daily  . mesalamine  2.4 g Oral Q breakfast  . QUEtiapine Fumarate  150 mg Oral QHS  . vancomycin  1,500 mg Intravenous Q12H   Continuous Infusions:    LOS: 2 days   Time Spent in minutes   30 minutes  Jammi Morrissette D.O. on 09/30/2015 at 1:51 PM  Between 7am to 7pm - Pager - [phone]  After 7pm go to [email or web address] - password [email or web address]  And look for the night coverage person covering for me after hours  [facility] Office  [phone]

## 2015-10-01 LAB — BASIC METABOLIC PANEL
Anion gap: 7 (ref 5–15)
CO2: 26 mmol/L (ref 22–32)
CREATININE: 0.76 mg/dL (ref 0.61–1.24)
Calcium: 8.7 mg/dL — ABNORMAL LOW (ref 8.9–10.3)
Chloride: 107 mmol/L (ref 101–111)
GFR calc Af Amer: >60 mL/min (ref >60)
GLUCOSE: 77 mg/dL (ref 65–99)
POTASSIUM: 3.6 mmol/L (ref 3.5–5.1)
Sodium: 140 mmol/L (ref 135–145)

## 2015-10-01 MED ORDER — MESALAMINE 1.2 G PO TBEC
2.4000 g | DELAYED_RELEASE_TABLET | Freq: Every day | ORAL | 0 refills | Status: AC
Start: 2015-10-01 — End: ?

## 2015-10-01 MED ORDER — DOXYCYCLINE HYCLATE 100 MG PO TBEC: 100.0000 mg | DELAYED_RELEASE_TABLET | Freq: Two times a day (BID) | ORAL | 0 refills | Status: AC

## 2015-10-01 MED ORDER — LITHIUM CARBONATE 150 MG PO CAPS: 150.0000 mg | ORAL_CAPSULE | Freq: Every day | ORAL | 0 refills | Status: AC

## 2015-10-01 MED ORDER — FLUOXETINE HCL 20 MG PO CAPS: 20.0000 mg | ORAL_CAPSULE | Freq: Two times a day (BID) | ORAL | 0 refills | Status: AC

## 2015-10-01 MED ORDER — QUETIAPINE FUMARATE ER 150 MG PO TB24: 150.0000 mg | ORAL_TABLET | Freq: Every day | ORAL | 0 refills | Status: AC

## 2015-10-01 NOTE — Discharge Summary (Signed)
Physician Discharge Summary  Jose Bowen ASN:[id number] DOB: 06/23/1995 DOA: 09/27/2015  PCP: No PCP Per Patient  Admit date: 09/27/2015 Discharge date: 10/01/2015  Time spent: 45 minutes  Recommendations for Outpatient Follow-up:  Patient will be discharged to home.  Patient will need to follow up with primary care provider within one week of discharge.  Follow up with gastroenterologist.  Take doxycycline with food and avoid the sun exposure. Patient should continue medications as prescribed.  Patient should follow a regular diet.   Discharge Diagnoses:  Active Problems:   Submandibular space infection   Ulcerative colitis (Jose Bowen)   Bipolar disorder (Jose Bowen)   H/O noncompliance with medical treatment, presenting hazards to health   Marijuana abuse   Abscess   Discharge Condition: Stable  Diet recommendation: Regular  Filed Weights   09/27/15 2134  Weight: 77.1 kg (170 lb)    History of present illness:  on 09/28/2015 by Dr. Eppie Gibson Bowen a 20 y.o.malewith medical history significant of UC and bipolar disorder presenting with a submental infection. He reports extreme pain on chin with increasing swelling since Thursday. Initially thought it was an ingrown hair buthe is not sure. No fevers. Having difficulty swallowing and speaking due to discomfort in chin region. No tongue edema, no throat pain or edema.  Hospital Course:  Submandibular cellulitis/?abscess -CT Soft tissue neck: Extensive inflammatory stranding and swelling and induration at the chin with extension into the submental region and bilateral submandibular spaces -ENT Surgery consulted and appreciated- continue IV antibiotics, unlikely that it will need surgical drainiange -Initially placed on vancomycin. Will discharge with doxycycline  Leukocytosis -Resolved, secondary to the above -Continue to monitor CBC  Ulcerative colitis -Continue mesalamine -Follow-up with gastroenterologist  or PCP upon discharge  ?Bipolar disorder -Continue Prozac, lithium, Seroquel  Medical noncompliance -Will need primary care physician, Case management met with patient.  Encouraged him to call the number on his insurance card to find PCP  Marijuana abuse -Toxicology screen positive for marijuana -Discussed cessation  Consultants ENT, Dr. Constance Holster  Procedures  None  Discharge Exam: Vitals:   09/30/15 0615 10/01/15 0607  BP: 110/71 122/72  Pulse: (!) 56 (!) 57  Resp: 16 14  Temp: 97.9 F (36.6 C) 98.2 F (36.8 C)   Exam  General: Well developed, well nourished, NAD  HEENT: NCAT, mucous membranes moist. Submandibular mass, no drainage at this time, edema improving.   Cardiovascular: S1 S2 auscultated, no murmurs, RRR  Respiratory: Clear to auscultation bilaterally with equal chest rise  Abdomen: Soft, nontender, nondistended, + bowel sounds  Extremities: warm dry without cyanosis clubbing or edema  Neuro: AAOx3, nonfocal   Psych: Appropriate mood and affect  Discharge Instructions Discharge Instructions    Discharge instructions    Complete by:  As directed    Patient will be discharged to home.  Patient will need to follow up with primary care provider within one week of discharge.  Follow up with gastroenterologist.  Take doxycycline with food and avoid the sun exposure. Patient should continue medications as prescribed.  Patient should follow a regular diet.     Current Discharge Medication List    START taking these medications   Details  doxycycline (DORYX) 100 MG EC tablet Take 1 tablet (100 mg total) by mouth 2 (two) times daily. Qty: 22 tablet, Refills: 0      CONTINUE these medications which have CHANGED   Details  FLUoxetine (PROZAC) 20 MG capsule Take 1 capsule (20 mg total) by  mouth 2 (two) times daily. Take 20 mg in the morning and 10 mg at night. Qty: 60 capsule, Refills: 0    lithium carbonate 150 MG capsule Take 1 capsule (150 mg total)  by mouth daily. Qty: 30 capsule, Refills: 0    mesalamine (LIALDA) 1.2 g EC tablet Take 2 tablets (2.4 g total) by mouth daily with breakfast. Qty: 60 tablet, Refills: 0    QUEtiapine Fumarate (SEROQUEL XR) 150 MG 24 hr tablet Take 1 tablet (150 mg total) by mouth at bedtime. Qty: 30 tablet, Refills: 0      CONTINUE these medications which have NOT CHANGED   Details  mesalamine (CANASA) 1000 MG suppository Place 1,000 mg rectally at bedtime as needed. For ulcerative colitis flare ups       No Known Allergies Follow-up Information    Primary care provider. Schedule an appointment as soon as possible for a visit in 1 week(s).   Why:  Establish care and follow up.           The results of significant diagnostics from this hospitalization (including imaging, microbiology, ancillary and laboratory) are listed below for reference.    Significant Diagnostic Studies: Ct Soft Tissue Neck W Contrast  Result Date: 09/27/2015 CLINICAL DATA:  Initial valuation for abscess on left chin. EXAM: CT NECK WITH CONTRAST TECHNIQUE: Multidetector CT imaging of the neck was performed using the standard protocol following the bolus administration of intravenous contrast. CONTRAST:  64m ISOVUE-300 IOPAMIDOL (ISOVUE-300) INJECTION 61% COMPARISON:  None. FINDINGS: Visualized portions of the brain are unremarkable. Partially visualized globes and orbits within normal limits. Retention cyst present within the right maxillary sinus. Minimal mucosal thickening within the ethmoidal air cells. Paranasal sinuses are otherwise clear. No mastoid effusion. Middle ear cavities are clear. Salivary glands including the parotid glands and submandibular glands are normal. There is extensive soft tissue swelling with inflammatory stranding involving the shin, extending into the submental region and bilateral submandibular spaces. Inflammatory stranding and induration extends posteriorly towards the carotid spaces posteriorly.  Inferior extension down the anterior aspect of the neck. Possible early involvement of the floor of mouth with the floor of mouth musculature somewhat enlarged and edematous in appearance. Tongue appears to be lifted superiorly. There is ill-defined hypodensity with faint rim enhancement at the chin that measures approximately 4.6 x 0.7 x 0.8 cm. Finding compatible with phlegmon/early abscess formation. Slight extension of phlegmon/abscess more posteriorly within the submental region (series 7, image 52). Superior extension anterior to the right mandibular body (series 3, image 59). Prominent level 1A nodes measure up to 9 mm, likely reactive. No other significant adenopathy within the neck. Oral cavity itself within normal limits. No acute abnormality about the dentition. Palatine tonsils normal. Parapharyngeal fat preserved. Nasopharynx normal. No retropharyngeal fluid collection. Epiglottis normal. Vallecula clear. Hypopharynx and supraglottic larynx within normal limits. True cords symmetric and normal. Subglottic airway grossly clear, although evaluation limited by motion. Thyroid gland grossly unremarkable, although evaluation limited by motion. Partially visualized mediastinum grossly unremarkable. Visualized lungs are clear. Normal intravascular enhancement seen throughout the neck. No acute osseous abnormality. No worrisome lytic or blastic osseous lesions. 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 Superimposed somewhat ill-defined phlegmon/early abscess at the chin as detailed above. Close clinical monitoring is recommended. Results were called by telephone at the time of interpretation on 09/27/2015 at 11:54 pm to the physician  assistant taking care the patient Carlisle Cater , who verbally acknowledged these results. Electronically Signed    By: Jeannine Boga M.D.   On: 09/27/2015 23:56    Microbiology: Recent Results (from the past 240 hour(s))  Wound or Superficial Culture     Status: None   Collection Time: 09/28/15  1:06 AM  Result Value Ref Range Status   Specimen Description FACE  Final   Special Requests Normal  Final   Gram Stain   Final    NO WBC SEEN FEW GRAM POSITIVE COCCI IN CLUSTERS Performed at [facility]    Culture   Final    MODERATE METHICILLIN RESISTANT STAPHYLOCOCCUS AUREUS   Report Status 09/30/2015 FINAL  Final   Organism ID, Bacteria METHICILLIN RESISTANT STAPHYLOCOCCUS AUREUS  Final      Susceptibility   Methicillin resistant staphylococcus aureus - MIC*    CIPROFLOXACIN <=0.5 SENSITIVE Sensitive     ERYTHROMYCIN >=8 RESISTANT Resistant     GENTAMICIN <=0.5 SENSITIVE Sensitive     OXACILLIN >=4 RESISTANT Resistant     TETRACYCLINE <=1 SENSITIVE Sensitive     VANCOMYCIN <=0.5 SENSITIVE Sensitive     TRIMETH/SULFA <=10 SENSITIVE Sensitive     CLINDAMYCIN <=0.25 SENSITIVE Sensitive     RIFAMPIN <=0.5 SENSITIVE Sensitive     Inducible Clindamycin NEGATIVE Sensitive     * MODERATE METHICILLIN RESISTANT STAPHYLOCOCCUS AUREUS     Labs: Basic Metabolic Panel:  Recent Labs Lab 09/27/15 2241 09/28/15 0500 09/29/15 0430 09/30/15 0504 10/01/15 0454  NA 137 138 139 139 140  K 3.4* 3.6 3.7 3.3* 3.6  CL 102 106 106 106 107  CO2 27 26 26 26 26   GLUCOSE 98 91 87 83 77  BUN 10 8 <5* <5* <5*  CREATININE 0.96 0.86 0.87 0.82 0.76  CALCIUM 8.7* 8.5* 8.4* 8.5* 8.7*   Liver Function Tests: No results for input(s): AST, ALT, ALKPHOS, BILITOT, PROT, ALBUMIN in the last 168 hours. No results for input(s): LIPASE, AMYLASE in the last 168 hours. No results for input(s): AMMONIA in the last 168 hours. CBC:  Recent Labs Lab 09/27/15 2241 09/28/15 0500 09/29/15 0430 09/30/15 0504  WBC 17.4* 16.7* 12.8* 8.6  NEUTROABS 13.5*  --   --   --   HGB 14.9 13.9 13.2 13.1  HCT 44.0 42.4  39.5 40.0  MCV 82.7 85.1 83.7 85.7  PLT 189 180 178 193   Cardiac Enzymes: No results for input(s): CKTOTAL, CKMB, CKMBINDEX, TROPONINI in the last 168 hours. BNP: BNP (last 3 results) No results for input(s): BNP in the last 8760 hours.  ProBNP (last 3 results) No results for input(s): PROBNP in the last 8760 hours.  CBG: No results for input(s): GLUCAP in the last 168 hours.     SignedCristal Ford  [facility] 10/01/2015, 10:27 AM

## 2015-10-01 NOTE — Progress Notes (Signed)
Discharge instructions discussed with patient, verbalized agreement and understanding, prescription given to patient 

## 2015-10-01 NOTE — Discharge Instructions (Signed)

## 2017-09-18 IMAGING — CT CT ABD-PELV W/ CM
2 of 4 series · 15 of 46 positions shown, 17 images · IV contrast (Omni 300)
Comparison: None

CLINICAL DATA: Ulcer colitis. Generalized abdominal pain. Some
nausea.

EXAM:
CT ABDOMEN AND PELVIS WITH CONTRAST
TECHNIQUE: Multidetector CT imaging of the abdomen and pelvis was performed
using the standard protocol following bolus administration of
intravenous contrast.
CONTRAST:  100mL OMNIPAQUE IOHEXOL 300 MG/ML  SOLN

[Series 3: a/p w/ 5mm · axial · 0.72mm/px · z∈[+952,+1387]mm · 12 of 95 slices shown, 14 images]
[im 4/95  soft-tissue]
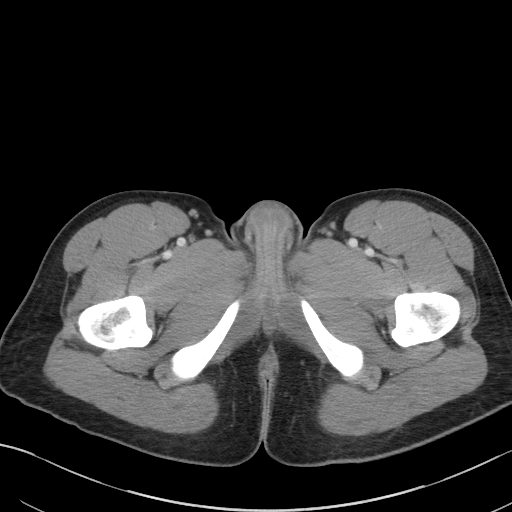
[im 4/95  bone]
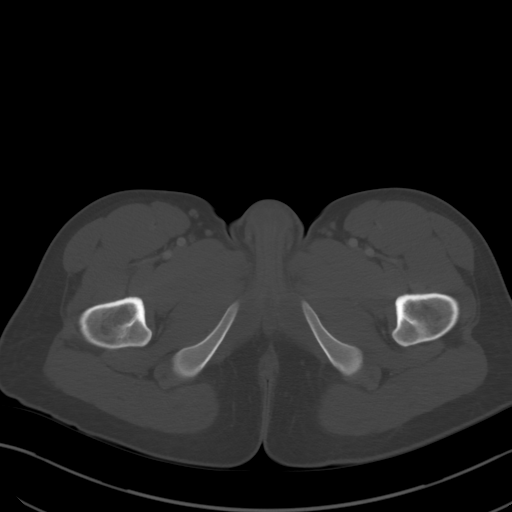
[im 12/95  soft-tissue]
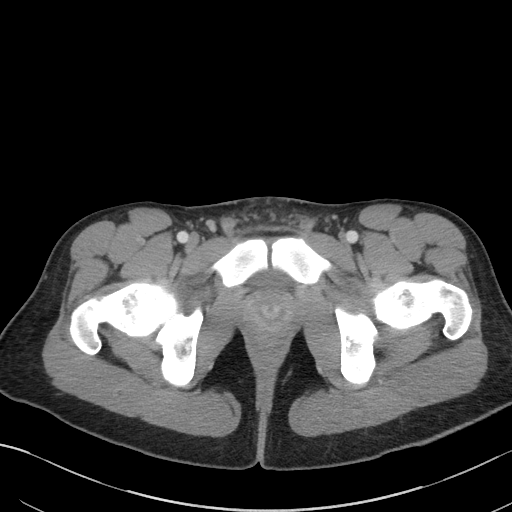
[im 20/95  soft-tissue]
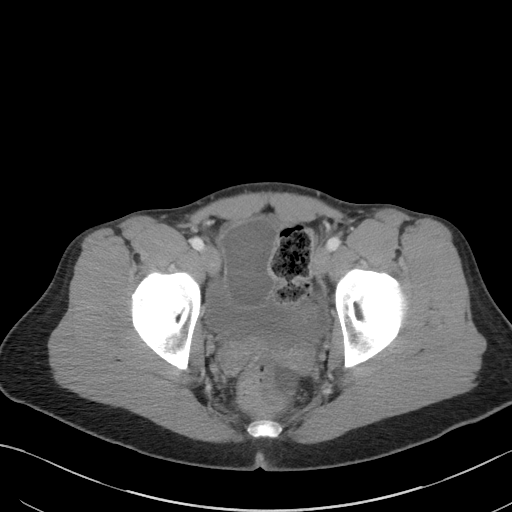
[im 28/95  soft-tissue]
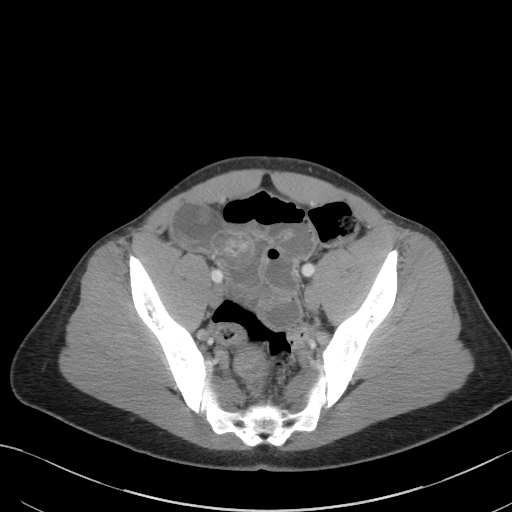
[im 36/95  soft-tissue]
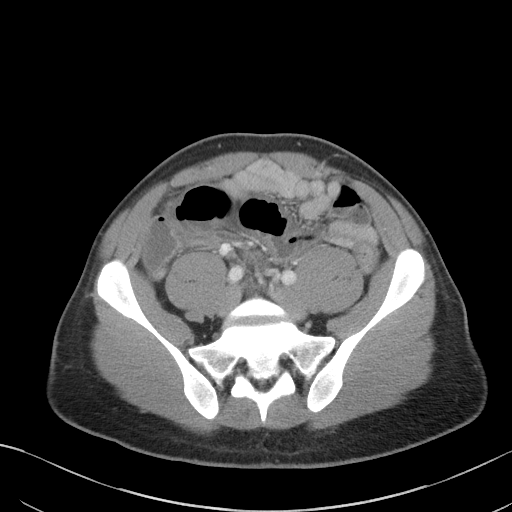
[im 44/95  soft-tissue]
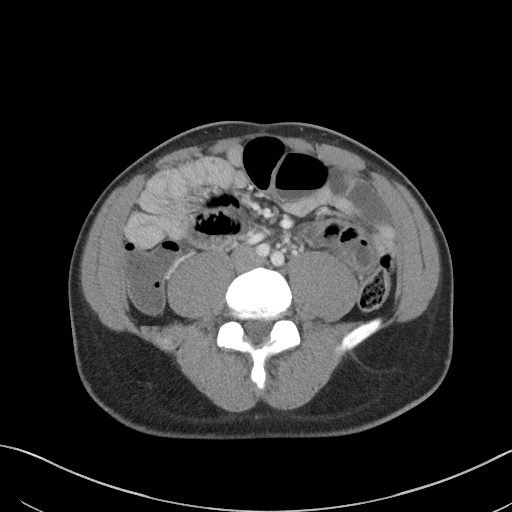
[im 51/95  soft-tissue]
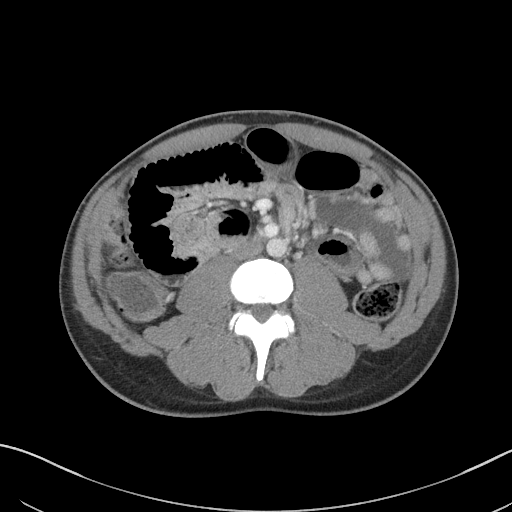
[im 59/95  soft-tissue]
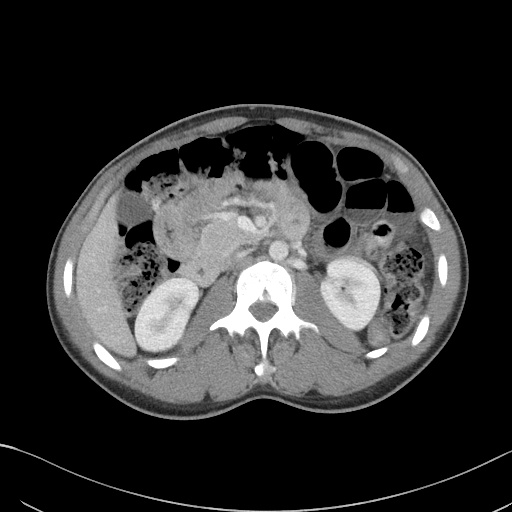
[im 67/95  soft-tissue]
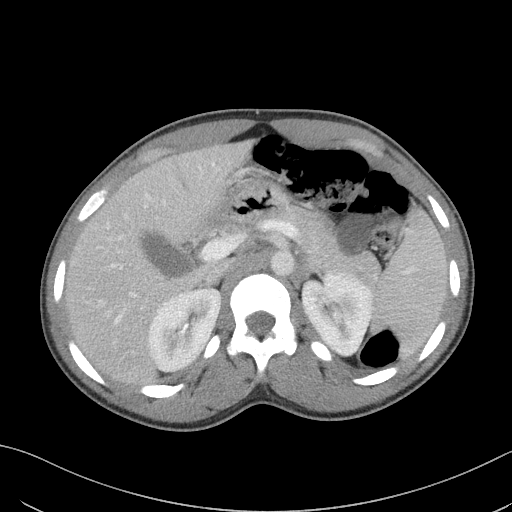
[im 67/95  bone]
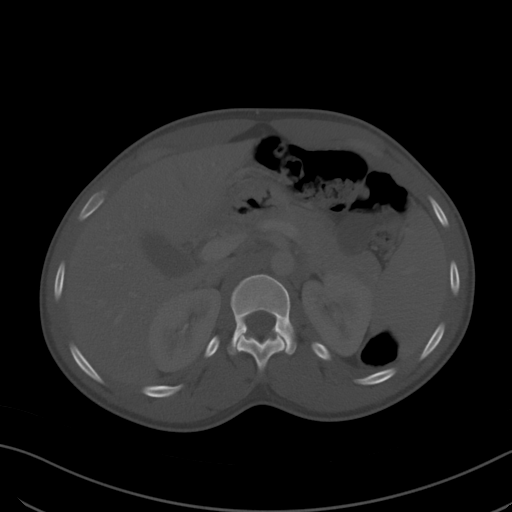
[im 75/95  soft-tissue]
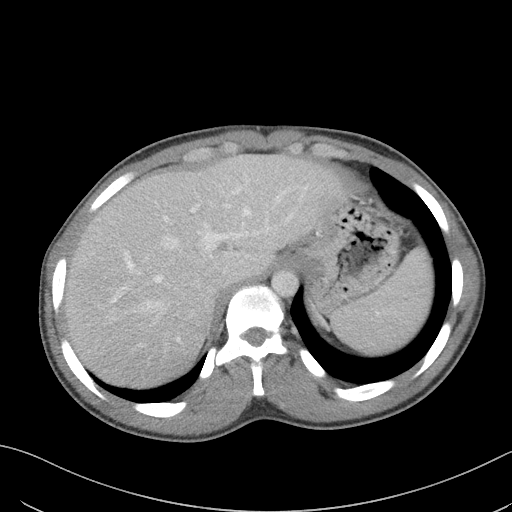
[im 83/95  soft-tissue]
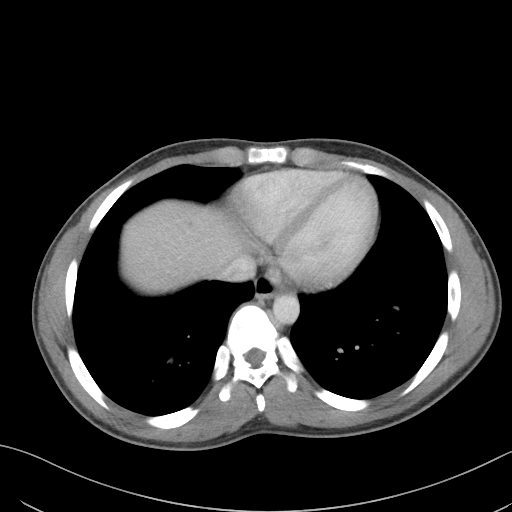
[im 91/95  soft-tissue]
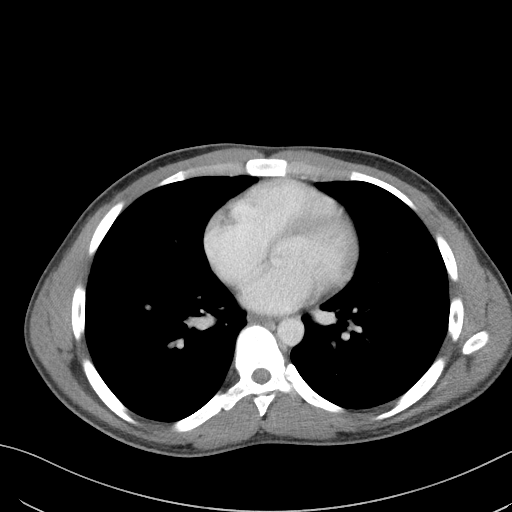

[Series 6: a/p w/ cor · coronal · 0.64mm/px · 3 of 122 slices shown]
[im 41/122  soft-tissue]
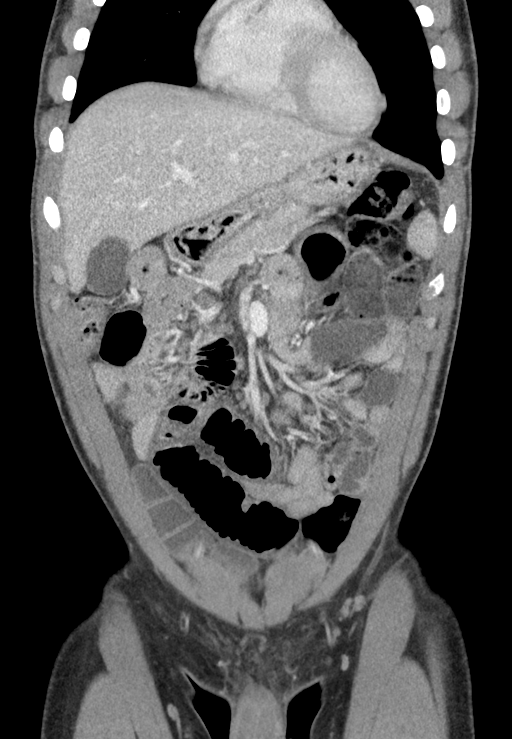
[im 54/122  soft-tissue]
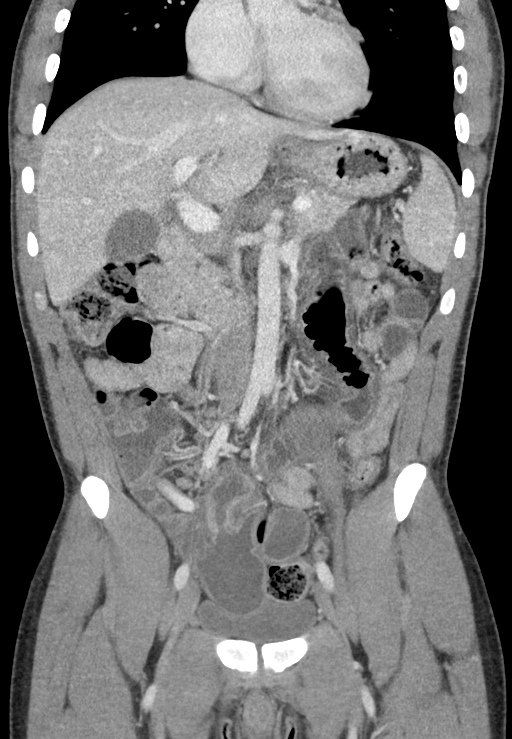
[im 68/122  soft-tissue]
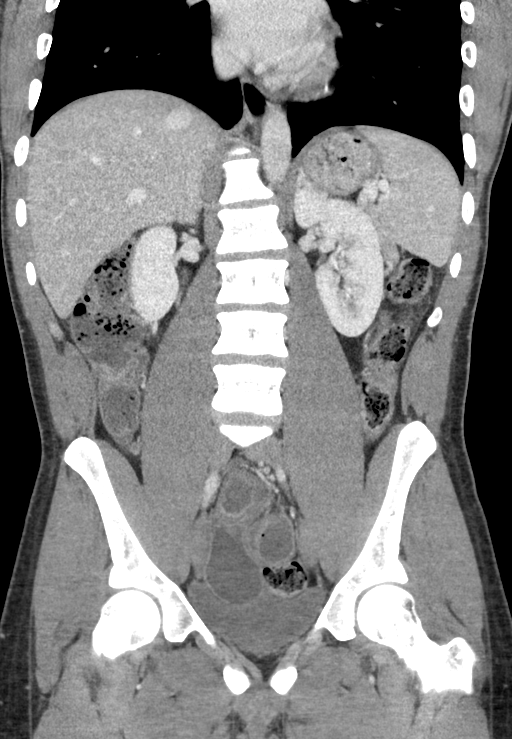

[15 of 46 positions shown; findings below may reference images not displayed]

FINDINGS: Lower chest: Lung bases are clear.

Hepatobiliary: No focal hepatic lesion. No biliary duct dilatation.
Gallbladder is normal. Common bile duct is normal.

Pancreas: Pancreas is normal. No ductal dilatation. No pancreatic
inflammation.

Spleen: Normal spleen

Adrenals/urinary tract: Adrenal glands and kidneys are normal. The
ureters and bladder normal.

Stomach/Bowel: The stomach and duodenum are normal. There multiple
abnormal loops of small bowel a which are moderately distended by
fluid and have submucosal edema. Majority these abnormal loops
appear distal in the ileum. The terminal ileum is mildly thickened
enhancing as seen on image 55, series 6. There is submucosal edema
in loops of distal ileum as seen on coronal image 65, series 6. The
submucosal edema measures up to 4 mm. Other loops of more proximal
small bowel are decompressed (image 32, series 6). No evidence of
high-grade obstruction.

The appendix is elongated and partially filled with high-density
material (image 65 series 3 and image 55 series 30. This elongated
appendix extends in a retrocecal course.

The colon itself appears normal. There is a moderate volume stool in
the transverse and descending colon. No evidence of mucosal
inflammation in the colon or sigmoid colon. The rectum is grossly
normal although collapsed distally.

Vascular/Lymphatic: Abdominal aorta is normal caliber. There is no
retroperitoneal or periportal lymphadenopathy. No pelvic
lymphadenopathy.

Reproductive: Smaller free fluid the pelvis.  Prostate gland normal.

Other: Small free fluid the pelvis

Musculoskeletal: No aggressive osseous lesion.
IMPRESSION: 1. Evidence of active inflammatory bowel disease with multiple long
segments of fluid-filled mildly distended small bowel with
submucosal edema. Inflammation of the terminal ileum. No evidence of
high-grade obstruction.
2. No evidence of involvement of the colon. The rectum is poorly
evaluated.
3. Appendix is elongated and mildly distended but no evidence of
inflammation
